# Patient Record
Sex: Male | Born: 1985 | Race: White | Hispanic: No | Marital: Single | Smoking: Never smoker
Health system: Southern US, Community
[De-identification: ages and names within clinical notes are randomized; demographics above are authoritative.]

## PROBLEM LIST (undated history)

## (undated) DIAGNOSIS — F410 Panic disorder [episodic paroxysmal anxiety] without agoraphobia: Secondary | ICD-10-CM

## (undated) DIAGNOSIS — F419 Anxiety disorder, unspecified: Secondary | ICD-10-CM

## (undated) DIAGNOSIS — I10 Essential (primary) hypertension: Secondary | ICD-10-CM

## (undated) HISTORY — PX: ANKLE SURGERY: SHX546

## (undated) HISTORY — PX: KNEE SURGERY: SHX244

## (undated) HISTORY — PX: ANKLE FRACTURE SURGERY: SHX122

---
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
## 2005-04-26 ENCOUNTER — Emergency Department (HOSPITAL_COMMUNITY): Admission: EM | Admit: 2005-04-26 | Discharge: 2005-04-26 | Payer: Self-pay | Admitting: Emergency Medicine

## 2006-02-04 ENCOUNTER — Ambulatory Visit: Payer: Self-pay | Admitting: Orthopedic Surgery

## 2006-03-05 ENCOUNTER — Ambulatory Visit: Payer: Self-pay | Admitting: Orthopedic Surgery

## 2006-05-31 ENCOUNTER — Emergency Department (HOSPITAL_COMMUNITY): Admission: EM | Admit: 2006-05-31 | Discharge: 2006-06-01 | Payer: Self-pay | Admitting: Emergency Medicine

## 2006-08-20 ENCOUNTER — Emergency Department (HOSPITAL_COMMUNITY): Admission: EM | Admit: 2006-08-20 | Discharge: 2006-08-20 | Payer: Self-pay | Admitting: Family Medicine

## 2006-09-17 ENCOUNTER — Emergency Department (HOSPITAL_COMMUNITY): Admission: EM | Admit: 2006-09-17 | Discharge: 2006-09-17 | Payer: Self-pay | Admitting: Emergency Medicine

## 2007-01-06 ENCOUNTER — Emergency Department: Payer: Self-pay | Admitting: Emergency Medicine

## 2007-01-22 ENCOUNTER — Emergency Department: Payer: Self-pay | Admitting: Emergency Medicine

## 2007-07-17 ENCOUNTER — Emergency Department: Payer: Self-pay | Admitting: Emergency Medicine

## 2007-12-22 ENCOUNTER — Emergency Department: Payer: Self-pay | Admitting: Emergency Medicine

## 2008-03-28 ENCOUNTER — Emergency Department (HOSPITAL_COMMUNITY): Admission: EM | Admit: 2008-03-28 | Discharge: 2008-03-28 | Payer: Self-pay | Admitting: Family Medicine

## 2008-08-16 ENCOUNTER — Emergency Department (HOSPITAL_COMMUNITY): Admission: EM | Admit: 2008-08-16 | Discharge: 2008-08-17 | Payer: Self-pay | Admitting: Emergency Medicine

## 2008-08-28 ENCOUNTER — Emergency Department (HOSPITAL_COMMUNITY): Admission: EM | Admit: 2008-08-28 | Discharge: 2008-08-28 | Payer: Self-pay | Admitting: Emergency Medicine

## 2009-02-19 ENCOUNTER — Emergency Department (HOSPITAL_COMMUNITY): Admission: EM | Admit: 2009-02-19 | Discharge: 2009-02-19 | Payer: Self-pay | Admitting: Emergency Medicine

## 2009-03-09 ENCOUNTER — Emergency Department (HOSPITAL_COMMUNITY): Admission: EM | Admit: 2009-03-09 | Discharge: 2009-03-09 | Payer: Self-pay | Admitting: Emergency Medicine

## 2010-08-11 ENCOUNTER — Inpatient Hospital Stay (HOSPITAL_COMMUNITY): Admission: EM | Admit: 2010-08-11 | Discharge: 2010-08-13 | Payer: Self-pay | Admitting: Orthopedic Surgery

## 2010-08-11 ENCOUNTER — Encounter: Payer: Self-pay | Admitting: Emergency Medicine

## 2011-04-12 NOTE — Op Note (Signed)
NAME:  Marvin Case, Marvin Case                         ACCOUNT NO.:  [id number]   MEDICAL RECORD NO.:  [id number]                   PATIENT TYPE:  AMB   LOCATION:  DSC                                  FACILITY:  [facility]   PHYSICIAN:  Robert A. Thurston Hole, M.D.              DATE OF BIRTH:  [birth year]   DATE OF PROCEDURE:  DATE OF DISCHARGE:                                 OPERATIVE REPORT   PREOPERATIVE DIAGNOSIS:  Left knee medial meniscus tear.   POSTOPERATIVE DIAGNOSIS:  Left knee medial meniscus tear.   PROCEDURE:  Left  exam under anesthesia, arthroscopic partial medial  meniscectomy.   SURGEON:  Elana Alm. Thurston Hole, M.D.   ASSISTANT:  Julien Girt, P.A.   ANESTHESIA:  General.   OPERATIVE TIME:  30 minutes.   COMPLICATIONS:  None.   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 Persistent pain  and intermittent swelling with examination and MRI documented a medial  meniscus tear who is now to undergo arthroscopy.   DESCRIPTION:  Jamieson was brought to the operating room on 08/23/02 and  placed on the operating table in supine position. After an adequate level of  general anesthesia was obtained, his left knee was examined under  anesthesia. Range of motion to 0 to 120 degrees, 1+ crepitation, stable  ligamentous exam with normal patellar tracking. Knee was sterilely injected  with 0.25% Marcaine with epinephrine. The leg was prepped using sterile  Duraprep and draped using sterile technique. Originally, through an  inferolateral portal, the arthroscope with a pump attached was placed into  an inferomedial portal, and arthroscopic probe was placed. On initial  inspection of the medial compartment, he had a displaced bucket-handle tear  of the posterior medial horn of the medial meniscus in the white-on-white  nonrepairable zone. This piece was removed and comprised 50% of the  posterior  medial horn. There was a very stable remaining  remnant intact.  The articular cartilage, medial femoral condyle, medial tibial plateau was  normal. Intercondylar notch inspected. Anterior and posterior cruciate  ligaments were normal. Lateral compartment inspected. Articular cartilage,  lateral femoral condyle, and lateral tibial plateau was normal.  Lateral  meniscus was probed, and this was found to be normal. Patellofemoral joint  inspected. The articular cartilage in this joint was normal. The patella  tracked normally. Medial and lateral gutters were free of pathology. After  this was done, it was felt that all pathology had been satisfactorily  addressed. The instruments were removed. Portals were closed with 3-0 nylon  sutures and injected with 0.25% Marcaine with epinephrine and 4 mg of  morphine. Sterile dressings applied, and the patient awakened and taken to  recovery room in stable condition.    FOLLOWUP CARE:  Khamron will be followed as an outpatient on Vicodin and  Naprosyn. See him  back in the office in a week for sutures out and followup.                                               Robert A. Thurston Hole, M.D.    RAW/MEDQ  D:  08/23/2002  T:  08/25/2002  Job:  [id number]

## 2012-11-14 ENCOUNTER — Other Ambulatory Visit: Payer: Self-pay

## 2012-11-15 ENCOUNTER — Emergency Department (HOSPITAL_COMMUNITY)
Admission: EM | Admit: 2012-11-15 | Discharge: 2012-11-15 | Discharge status: Against Medical Advice | Payer: Self-pay | Attending: Emergency Medicine | Admitting: Emergency Medicine

## 2012-11-15 ENCOUNTER — Encounter (HOSPITAL_COMMUNITY): Payer: Self-pay | Admitting: *Deleted

## 2012-11-15 DIAGNOSIS — R0602 Shortness of breath: Secondary | ICD-10-CM | POA: Insufficient documentation

## 2012-11-15 DIAGNOSIS — R079 Chest pain, unspecified: Secondary | ICD-10-CM | POA: Insufficient documentation

## 2012-11-15 LAB — CBC WITH DIFFERENTIAL/PLATELET
Basophils Absolute: 0 10*3/uL (ref 0.0–0.1)
Basophils Relative: 0 % (ref 0–1)
Eosinophils Absolute: 0.2 10*3/uL (ref 0.0–0.7)
Eosinophils Relative: 1 % (ref 0–5)
MCH: 31.7 pg (ref 26.0–34.0)
MCV: 89.9 fL (ref 78.0–100.0)
RDW: 12.8 % (ref 11.5–15.5)
WBC: 12 10*3/uL — ABNORMAL HIGH (ref 4.0–10.5)

## 2012-11-15 LAB — COMPREHENSIVE METABOLIC PANEL
Alkaline Phosphatase: 76 U/L (ref 39–117)
CO2: 25 mEq/L (ref 19–32)
Calcium: 9.4 mg/dL (ref 8.4–10.5)
Chloride: 97 mEq/L (ref 96–112)
Creatinine, Ser: 1.03 mg/dL (ref 0.50–1.35)
GFR calc non Af Amer: >90 mL/min (ref >90)
Glucose, Bld: 165 mg/dL — ABNORMAL HIGH (ref 70–99)
Potassium: 3.4 mEq/L — ABNORMAL LOW (ref 3.5–5.1)
Sodium: 137 mEq/L (ref 135–145)
Total Protein: 7.6 g/dL (ref 6.0–8.3)

## 2012-11-15 NOTE — ED Notes (Signed)
No answer x2.  Did not get his chest xray.  He had already gone whenever xray called for him

## 2012-11-15 NOTE — ED Notes (Signed)
Pt c/o sob and dull chest pain for 45 minutes.  History of the same

## 2012-11-17 ENCOUNTER — Emergency Department (INDEPENDENT_AMBULATORY_CARE_PROVIDER_SITE_OTHER)
Admission: EM | Admit: 2012-11-17 | Discharge: 2012-11-17 | Disposition: A | Discharge status: Home / Self Care | Payer: Self-pay | Source: Home / Self Care

## 2012-11-17 ENCOUNTER — Encounter (HOSPITAL_COMMUNITY): Payer: Self-pay | Admitting: Emergency Medicine

## 2012-11-17 DIAGNOSIS — F4001 Agoraphobia with panic disorder: Secondary | ICD-10-CM

## 2012-11-17 MED ORDER — LORAZEPAM 1 MG PO TABS: ORAL_TABLET | ORAL | Status: DC

## 2012-11-17 NOTE — ED Notes (Signed)
Pt c/o poss panic attacks... States he was at an event this past Saturday when he started "panacking"... Sx included: SOB, chest pressure... Pt today is asymptomatic... Father being treated w/panic attacks.

## 2012-11-17 NOTE — ED Provider Notes (Signed)
History     CSN: [id number]  Arrival date & time 11/17/12  1251   First MD Initiated Contact with Patient 11/17/12 1408      Chief Complaint  Patient presents with  . Panic Attack    (Consider location/radiation/quality/duration/timing/severity/associated sxs/prior treatment) HPI Comments: 26 year old male who was at a concert 3 nights ago and developed a sense of anxiety and claustrophobia. He was developing increased respiratory rate and palpitations. He left the concert went outside to get fresh air and he felt much better. His symptoms abated at that time. Since that occasion he is developed 2 additional episodes of anxiety where he felt as though the walls were closing in on him and being in a cry out or with other people were suffocating him. He has not had chest pain or shortness of breath. He states fresh air will help him. He works as a Aeronautical engineer. He denies history of cardiopulmonary disease or other known mental disorder.   History reviewed. No pertinent past medical history.  History reviewed. No pertinent past surgical history.  No family history on file.  History  Substance Use Topics  . Smoking status: Never Smoker   . Smokeless tobacco: Not on file  . Alcohol Use: Yes      Review of Systems  Constitutional: Negative.   HENT: Negative.   Respiratory: Negative.   Cardiovascular: Negative.   Gastrointestinal: Negative.   Neurological: Negative.   Psychiatric/Behavioral: The patient is nervous/anxious.     Allergies  Review of patient's allergies indicates no known allergies.  Home Medications   Current Outpatient Rx  Name  Route  Sig  Dispense  Refill  . LORAZEPAM 1 MG PO TABS      Take 1 at onset of panic attack at least 8 hours apart.   20 tablet   0     BP 136/82  Pulse 85  Temp 98.3 F (36.8 C) (Oral)  Resp 18  SpO2 99%  Physical Exam  Nursing note and vitals reviewed. Constitutional: He is oriented to  person, place, and time. He appears well-developed and well-nourished. No distress.  HENT:  Head: Normocephalic and atraumatic.  Eyes: Conjunctivae normal and EOM are normal.  Neck: Normal range of motion. Neck supple.  Cardiovascular: Normal rate, regular rhythm and normal heart sounds.   Pulmonary/Chest: Effort normal and breath sounds normal. No respiratory distress. He has no wheezes.  Musculoskeletal: Normal range of motion.  Lymphadenopathy:    He has no cervical adenopathy.  Neurological: He is alert and oriented to person, place, and time. No cranial nerve deficit. He exhibits normal muscle tone.  Skin: Skin is warm and dry. No rash noted.  Psychiatric: He has a normal mood and affect. His behavior is normal. Judgment and thought content normal. His speech is not rapid and/or pressured, not delayed, not tangential and not slurred. He is not agitated, not aggressive, is not hyperactive, not slowed, not withdrawn, not actively hallucinating and not combative. Cognition and memory are normal. He expresses no homicidal and no suicidal ideation. He is communicative. He is attentive.    ED Course  Procedures (including critical care time)  Labs Reviewed - No data to display No results found.   1. Panic disorder with agoraphobia       MDM  We discussed the nature of anxiety and panic attacks. He is aware that often this can smoker all and panic attacks they become more frequent and difficult to control. I strongly  recommended he obtain a physician to discuss this with possibly treat with an SSRI. In the meantime I have given him Ativan 1 mg to take 1/2-1 tablet every 8 hours if needed for panic disorder. Have asked him not to take it on an every 8 hour schedule. For any problems the symptoms are worsening may return. He is stable at this time and not exhibiting any panic behavior. His vital signs are stable, cognitively he is intact. Emotionally he is not currently demonstrating  outwardly anxiety or panic symptoms.       \  Hayden Rasmussen, NP 11/17/12 1432

## 2012-11-23 NOTE — ED Provider Notes (Signed)
Medical screening examination/treatment/procedure(s) were performed by resident physician or non-physician practitioner and as supervising physician I was immediately available for consultation/collaboration.   Barkley Bruns MD.    Linna Hoff, MD 11/23/12 [phone]

## 2012-12-28 ENCOUNTER — Emergency Department (HOSPITAL_COMMUNITY): Payer: Self-pay

## 2012-12-28 ENCOUNTER — Emergency Department (HOSPITAL_COMMUNITY)
Admission: EM | Admit: 2012-12-28 | Discharge: 2012-12-28 | Disposition: A | Discharge status: Home / Self Care | Payer: Self-pay | Attending: Emergency Medicine | Admitting: Emergency Medicine

## 2012-12-28 ENCOUNTER — Encounter (HOSPITAL_COMMUNITY): Payer: Self-pay | Admitting: Emergency Medicine

## 2012-12-28 DIAGNOSIS — R0602 Shortness of breath: Secondary | ICD-10-CM | POA: Insufficient documentation

## 2012-12-28 DIAGNOSIS — R000 Tachycardia, unspecified: Secondary | ICD-10-CM | POA: Insufficient documentation

## 2012-12-28 DIAGNOSIS — R61 Generalized hyperhidrosis: Secondary | ICD-10-CM | POA: Insufficient documentation

## 2012-12-28 DIAGNOSIS — F411 Generalized anxiety disorder: Secondary | ICD-10-CM | POA: Insufficient documentation

## 2012-12-28 DIAGNOSIS — M940 Chondrocostal junction syndrome [Tietze]: Secondary | ICD-10-CM | POA: Insufficient documentation

## 2012-12-28 DIAGNOSIS — Z79899 Other long term (current) drug therapy: Secondary | ICD-10-CM | POA: Insufficient documentation

## 2012-12-28 DIAGNOSIS — M6281 Muscle weakness (generalized): Secondary | ICD-10-CM | POA: Insufficient documentation

## 2012-12-28 LAB — BASIC METABOLIC PANEL
Creatinine, Ser: 0.92 mg/dL (ref 0.50–1.35)
GFR calc Af Amer: >90 mL/min (ref >90)
GFR calc non Af Amer: >90 mL/min (ref >90)
Potassium: 3.7 mEq/L (ref 3.5–5.1)

## 2012-12-28 LAB — D-DIMER, QUANTITATIVE: D-Dimer, Quant: 0.52 ug/mL-FEU — ABNORMAL HIGH (ref 0.00–0.48)

## 2012-12-28 LAB — CBC
Hemoglobin: 16.5 g/dL (ref 13.0–17.0)
MCH: 31.4 pg (ref 26.0–34.0)
Platelets: 216 10*3/uL (ref 150–400)
RBC: 5.25 MIL/uL (ref 4.22–5.81)
RDW: 12.3 % (ref 11.5–15.5)
WBC: 12.7 10*3/uL — ABNORMAL HIGH (ref 4.0–10.5)

## 2012-12-28 MED ORDER — IOHEXOL 350 MG/ML SOLN
100.0000 mL | Freq: Once | INTRAVENOUS | Status: AC | PRN
  Administered 2012-12-28: 100 mL via INTRAVENOUS

## 2012-12-28 MED ORDER — IBUPROFEN 800 MG PO TABS
800.0000 mg | ORAL_TABLET | Freq: Once | ORAL | Status: AC
  Administered 2012-12-28: 800 mg via ORAL
  Filled 2012-12-28: qty 1

## 2012-12-28 NOTE — ED Provider Notes (Signed)
History     CSN: [id number]  Arrival date & time 12/28/12  1503   First MD Initiated Contact with Patient 12/28/12 1610      Chief Complaint  Patient presents with  . Chest Pain    (Consider location/radiation/quality/duration/timing/severity/associated sxs/prior treatment) HPI Comments: 27 year old male with a history of anxiety presents to the emergency department complaining of chest pain for the past 4 hours. Pain began when he was driving this afternoon. Pain located on the left lateral aspect of his chest described as dull, achy, nonradiating rated 2/10. Admits to associated diaphoresis and mild left arm weakness. Patient drove out to [place] 3 weeks ago and was seen in the emergency department out there for similar symptoms, was given Ativan and was told there was nothing else wrong at that time. Ativan did not alleviate his symptoms. Denies nausea, vomiting, shortness of breath, leg swelling, calf pain, fever or chills. He drove back from [place] about one week ago. Denies personal and family history of heart problems. He is a nonsmoker.  The history is provided by the patient.    History reviewed. No pertinent past medical history.  History reviewed. No pertinent past surgical history.  History reviewed. No pertinent family history.  History  Substance Use Topics  . Smoking status: Never Smoker   . Smokeless tobacco: Not on file  . Alcohol Use: Yes      Review of Systems  Constitutional: Positive for diaphoresis.  HENT: Negative for neck pain and neck stiffness.   Respiratory: Negative for shortness of breath.   Cardiovascular: Positive for chest pain. Negative for leg swelling.  Gastrointestinal: Negative for nausea, vomiting and abdominal pain.  Musculoskeletal: Negative for back pain.  Skin: Negative for rash.  Neurological: Positive for weakness. Negative for dizziness and light-headedness.  All other systems reviewed and are negative.    Allergies    Review of patient's allergies indicates no known allergies.  Home Medications   Current Outpatient Rx  Name  Route  Sig  Dispense  Refill  . LORAZEPAM 1 MG PO TABS      Take 1 at onset of panic attack at least 8 hours apart.   20 tablet   0     BP 153/101  Pulse 140  Temp 98.9 F (37.2 C) (Oral)  Resp 22  SpO2 97%  Physical Exam  Nursing note and vitals reviewed. Constitutional: He is oriented to person, place, and time. He appears well-developed and well-nourished. No distress.  HENT:  Head: Normocephalic and atraumatic.  Mouth/Throat: Oropharynx is clear and moist. No oropharyngeal exudate.  Eyes: Conjunctivae normal and EOM are normal. Pupils are equal, round, and reactive to light. Right eye exhibits no discharge. Left eye exhibits no discharge.  Neck: Normal range of motion. Neck supple. No JVD present.  Cardiovascular: Normal rate, regular rhythm, normal heart sounds and intact distal pulses.  Exam reveals no gallop and no friction rub.   No murmur heard. Pulses:      Radial pulses are 2+ on the right side, and 2+ on the left side.       Dorsalis pedis pulses are 2+ on the right side, and 2+ on the left side.  Pulmonary/Chest: Effort normal and breath sounds normal. No respiratory distress. He has no wheezes. He has no rales.    Abdominal: Soft. Bowel sounds are normal. He exhibits no distension and no mass. There is no tenderness. There is no rebound and no guarding.  Musculoskeletal: Normal range of  motion.  Lymphadenopathy:    He has no cervical adenopathy.  Neurological: He is alert and oriented to person, place, and time. He has normal strength. No cranial nerve deficit or sensory deficit.  Skin: Skin is warm and dry. He is not diaphoretic.  Psychiatric: He has a normal mood and affect.    ED Course  Procedures (including critical care time)  Labs Reviewed  CBC - Abnormal; Notable for the following:    WBC 12.7 (*)     All other components within  normal limits  BASIC METABOLIC PANEL - Abnormal; Notable for the following:    Glucose, Bld 162 (*)     All other components within normal limits  D-DIMER, QUANTITATIVE - Abnormal; Notable for the following:    D-Dimer, Quant 0.52 (*)     All other components within normal limits  POCT I-STAT TROPONIN I    Date: 12/28/2012  Rate: 134  Rhythm: sinus tachycardia  QRS Axis: normal  Intervals: normal  ST/T Wave abnormalities: normal  Conduction Disutrbances:none  Narrative Interpretation: sinus tachycardia  Old EKG Reviewed: unchanged   Dg Chest 2 View  12/28/2012  *RADIOLOGY REPORT*  Clinical Data: Chest pain.  CHEST - 2 VIEW  Comparison: Chest CT 04/26/2005  Findings: Two views of the chest demonstrate clear lungs. Heart and mediastinum are within normal limits.  The trachea is midline. Bony thorax is intact.  IMPRESSION: Normal chest examination.   Original Report Authenticated By: Richarda Overlie, M.D.    Ct Angio Chest Pe W/cm &/or Wo Cm  12/28/2012  *RADIOLOGY REPORT*  Clinical Data: Elevated D-dimer and chest pain.  CT ANGIOGRAPHY CHEST  Technique:  Multidetector CT imaging of the chest using the standard protocol during bolus administration of intravenous contrast. Multiplanar reconstructed images including MIPs were obtained and reviewed to evaluate the vascular anatomy.  Contrast: OMNIPAQUE IOHEXOL 350 MG/ML SOLN  Comparison: Chest radiograph 12/28/2012  Findings: Negative for pulmonary embolism.  Normal appearance of the thoracic aorta and great vessels.  9 mm hypodensity along the hepatic dome is likely an incidental finding.  No evidence for chest lymphadenopathy.  The trachea and mainstem bronchi are patent.  Lungs are clear bilaterally.  No acute bony abnormality.  IMPRESSION: Negative for pulmonary embolism.  No acute chest findings.   Original Report Authenticated By: Richarda Overlie, M.D.      1. Costochondritis   2. Shortness of breath       MDM  27 year old male with  shortness of breath and tachycardia. D-dimer was elevated warranting need for CTA chest which was negative for pulmonary embolism. EKG normal besides tachycardia. Chest x-ray normal. Heart rate decreased to 95 after laying in the emergency department. He is tender in the left side of his chest making the diagnosis most likely chest wall pain/costochondritis. He does not have a primary care physician. Resource list given for PCP followup. Close return precautions discussed. Patient states his understanding of plan and is agreeable. Case discussed with Dr. Manus Gunning who also evaluated patient and agrees with plan of care        Trevor Mace, Cordelia Poche 12/28/12 2112

## 2012-12-28 NOTE — ED Notes (Signed)
Pt reports he was driving between jobs after eating lunch today and noticed his heart start racing. States came right in. Pt in mild distress, diaphoretic. States history of same and recent ER visit prescribed ativan. States no medical problems. C/o dull, achy pain to left chest and ribs.

## 2012-12-28 NOTE — ED Notes (Signed)
Patient transported to CT 

## 2012-12-29 NOTE — ED Provider Notes (Signed)
Medical screening examination/treatment/procedure(s) were conducted as a shared visit with non-physician practitioner(s) and myself.  I personally evaluated the patient during the encounter  L sided chest pain while driving with recent car travel. Sinus tachycardia and anxious on arrival. TTP L chest wall. Breath sounds equal.  Glynn Octave, MD 12/29/12 [phone]

## 2013-01-01 ENCOUNTER — Encounter (HOSPITAL_COMMUNITY): Payer: Self-pay | Admitting: Emergency Medicine

## 2013-01-01 ENCOUNTER — Emergency Department (HOSPITAL_COMMUNITY)
Admission: EM | Admit: 2013-01-01 | Discharge: 2013-01-01 | Discharge status: Against Medical Advice | Payer: Self-pay | Attending: Emergency Medicine | Admitting: Emergency Medicine

## 2013-01-01 DIAGNOSIS — R51 Headache: Secondary | ICD-10-CM | POA: Insufficient documentation

## 2013-01-01 DIAGNOSIS — R079 Chest pain, unspecified: Secondary | ICD-10-CM | POA: Insufficient documentation

## 2013-01-01 DIAGNOSIS — R5381 Other malaise: Secondary | ICD-10-CM | POA: Insufficient documentation

## 2013-01-01 NOTE — ED Notes (Signed)
Pts ekg performed at [facility]

## 2013-01-01 NOTE — ED Notes (Signed)
Pt reports chest pain that is sharp 10/10 that is worse today. Pt reports not feeling well today. Pt in NAD. Skin warm and dry. Denies drug use or ETOH.

## 2013-01-01 NOTE — ED Notes (Signed)
Pt called 3 times, no response 

## 2013-02-13 ENCOUNTER — Emergency Department: Payer: Self-pay | Admitting: Emergency Medicine

## 2013-02-13 LAB — CBC WITH DIFFERENTIAL/PLATELET
Basophil #: 0.1 10*3/uL (ref 0.0–0.1)
Eosinophil #: 0.1 10*3/uL (ref 0.0–0.7)
Eosinophil %: 0.9 %
HCT: 44.8 % (ref 40.0–52.0)
MCH: 29.7 pg (ref 26.0–34.0)
Monocyte #: 0.8 x10 3/mm (ref 0.2–1.0)
Monocyte %: 6.6 %
Platelet: 251 10*3/uL (ref 150–440)
WBC: 12 10*3/uL — ABNORMAL HIGH (ref 3.8–10.6)

## 2013-02-13 LAB — COMPREHENSIVE METABOLIC PANEL
Albumin: 4.1 g/dL (ref 3.4–5.0)
Alkaline Phosphatase: 68 U/L (ref 50–136)
BUN: 11 mg/dL (ref 7–18)
Bilirubin,Total: 0.5 mg/dL (ref 0.2–1.0)
Chloride: 105 mmol/L (ref 98–107)
Osmolality: 279 (ref 275–301)
Potassium: 3.3 mmol/L — ABNORMAL LOW (ref 3.5–5.1)
SGOT(AST): 26 U/L (ref 15–37)
Sodium: 139 mmol/L (ref 136–145)
Total Protein: 8 g/dL (ref 6.4–8.2)

## 2013-02-13 LAB — URINALYSIS, COMPLETE
Bacteria: NONE SEEN
Bilirubin,UR: NEGATIVE
Glucose,UR: NEGATIVE mg/dL (ref 0–75)
Ketone: NEGATIVE
Nitrite: NEGATIVE
Ph: 6 (ref 4.5–8.0)
Protein: NEGATIVE
RBC,UR: 1 /HPF (ref 0–5)
Squamous Epithelial: <1

## 2013-02-13 LAB — TROPONIN I: Troponin-I: <0.02 ng/mL

## 2013-04-21 IMAGING — CT CT ANGIO CHEST
2 of 8 series · 16 of 36 positions shown · IV contrast (CONTRAST)
Comparison: Chest radiograph 12/28/2012

CLINICAL DATA: Elevated D-dimer and chest pain.

CT ANGIOGRAPHY CHEST
TECHNIQUE: Multidetector CT imaging of the chest using the
standard protocol during bolus administration of intravenous
contrast. Multiplanar reconstructed images including MIPs were
obtained and reviewed to evaluate the vascular anatomy.
Contrast: 100mL OMNIPAQUE IOHEXOL 350 MG/ML SOLN

[Series 5: pe thins · axial · 0.81mm/px · z∈[+451,+710]mm · 15 of 297 slices shown]
[im 19/297  lung]
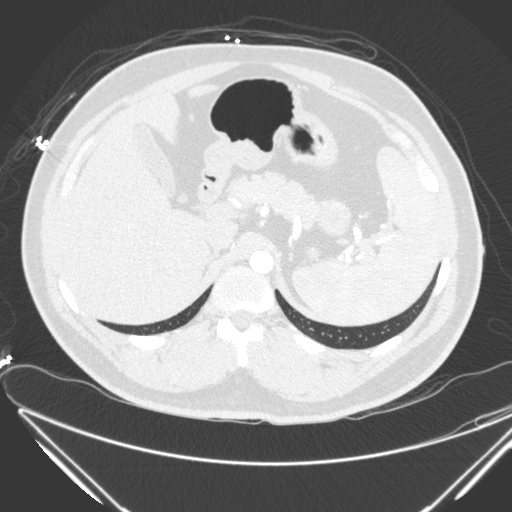
[im 38/297  mediastinal]
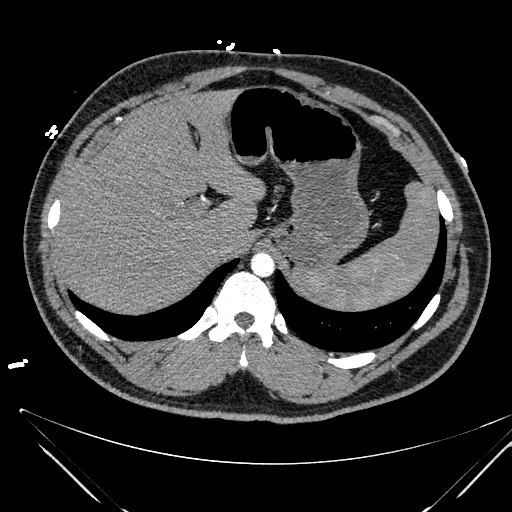
[im 56/297  lung]
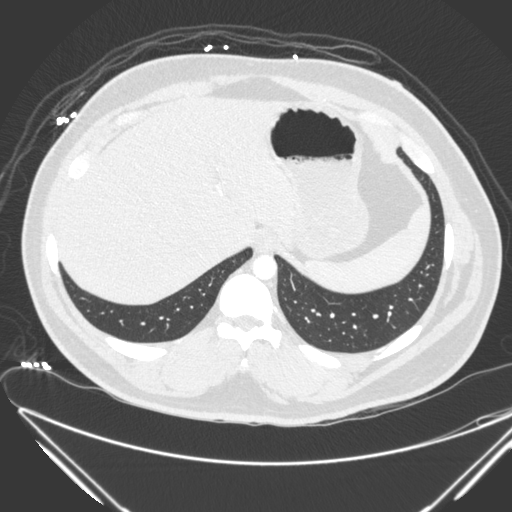
[im 75/297  mediastinal]
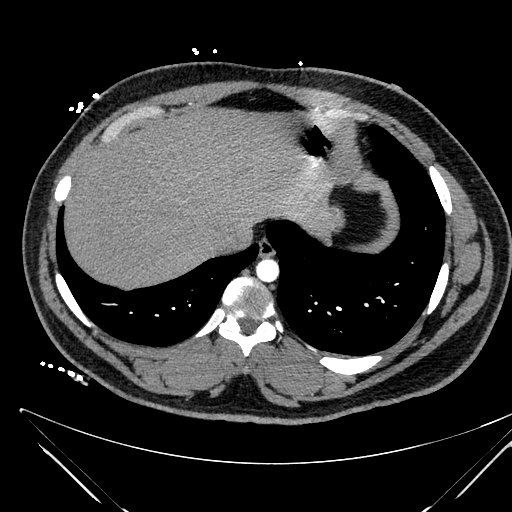
[im 93/297  lung]
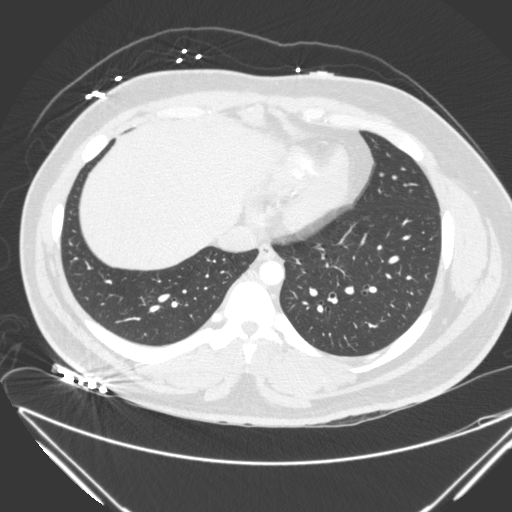
[im 112/297  mediastinal]
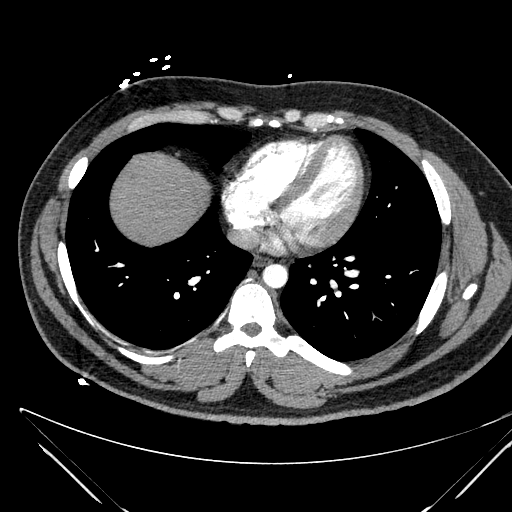
[im 130/297  lung]
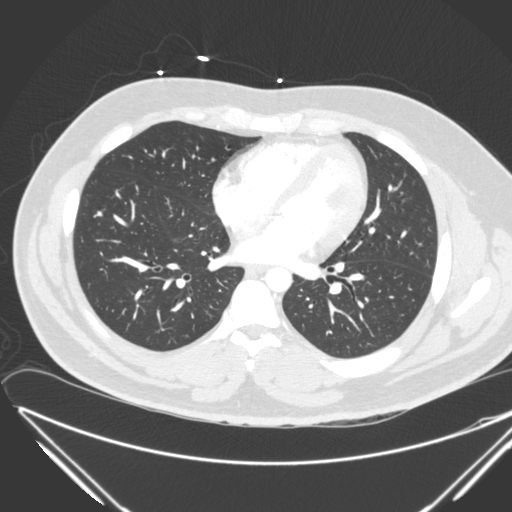
[im 149/297  mediastinal]
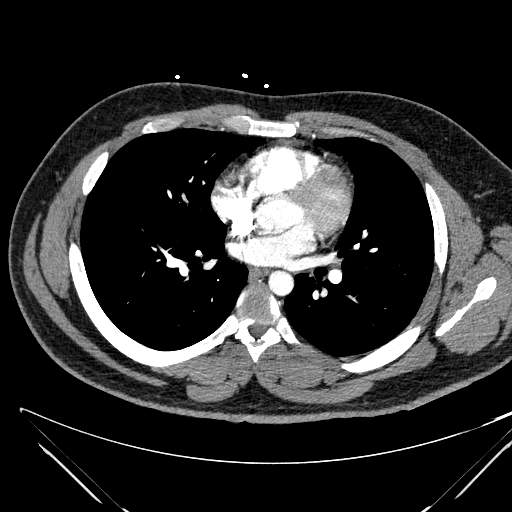
[im 167/297  lung]
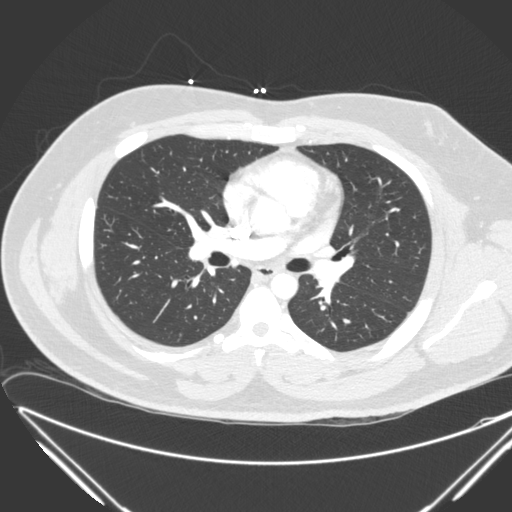
[im 186/297  mediastinal]
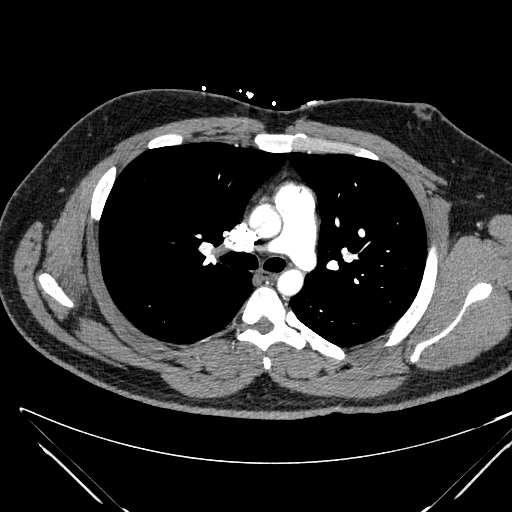
[im 204/297  lung]
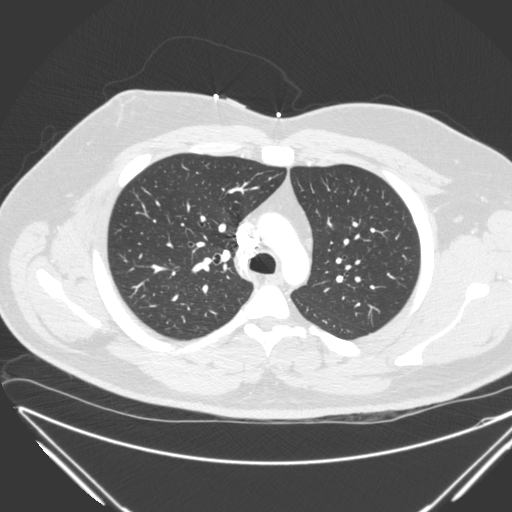
[im 223/297  mediastinal]
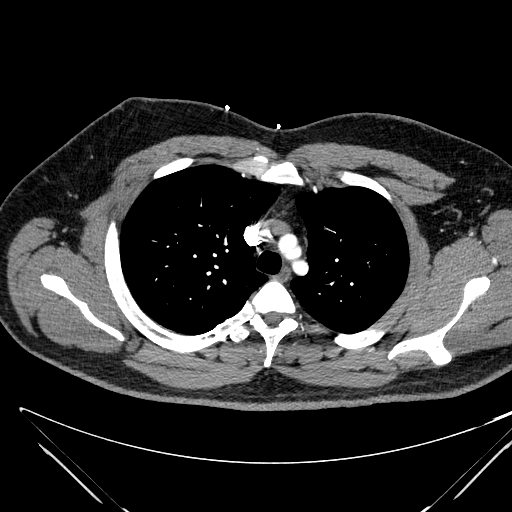
[im 241/297  lung]
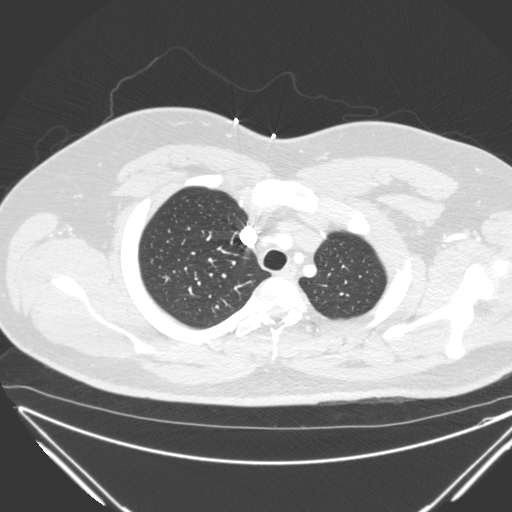
[im 260/297  mediastinal]
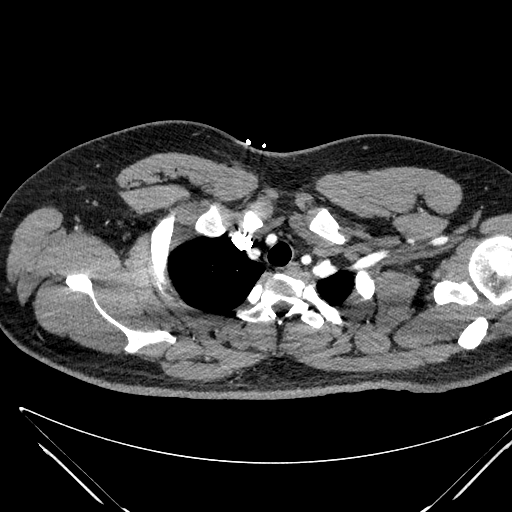
[im 278/297  lung]
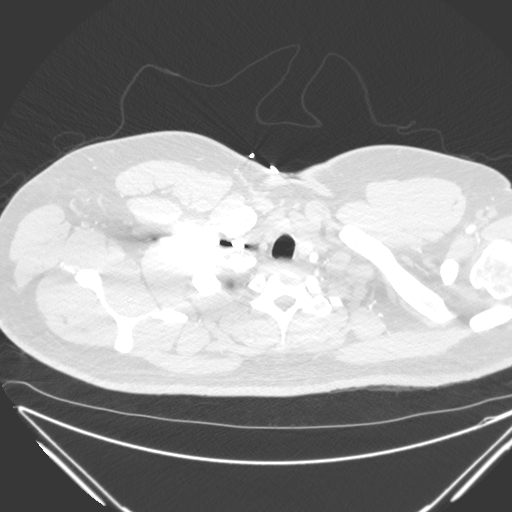

[mpr, coronals, coronal · coronal · 0.81mm/px · 1 of 142 slices shown]
[im 71/142  mediastinal]
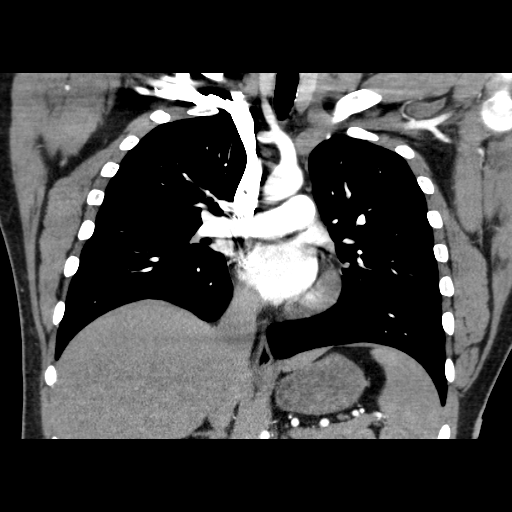

[16 of 36 positions shown; findings below may reference images not displayed]

FINDINGS: Negative for pulmonary embolism.  Normal appearance of
the thoracic aorta and great vessels.  9 mm hypodensity along the
hepatic dome is likely an incidental finding.  No evidence for
chest lymphadenopathy.

The trachea and mainstem bronchi are patent.  Lungs are clear
bilaterally.  No acute bony abnormality.
IMPRESSION: Negative for pulmonary embolism.

No acute chest findings.

## 2013-10-24 ENCOUNTER — Emergency Department (HOSPITAL_COMMUNITY)
Admission: EM | Admit: 2013-10-24 | Discharge: 2013-10-24 | Disposition: A | Discharge status: Home / Self Care | Payer: Self-pay | Attending: Emergency Medicine | Admitting: Emergency Medicine

## 2013-10-24 ENCOUNTER — Encounter (HOSPITAL_COMMUNITY): Payer: Self-pay | Admitting: Emergency Medicine

## 2013-10-24 DIAGNOSIS — J069 Acute upper respiratory infection, unspecified: Secondary | ICD-10-CM | POA: Insufficient documentation

## 2013-10-24 DIAGNOSIS — R51 Headache: Secondary | ICD-10-CM | POA: Insufficient documentation

## 2013-10-24 DIAGNOSIS — R110 Nausea: Secondary | ICD-10-CM | POA: Insufficient documentation

## 2013-10-24 DIAGNOSIS — R61 Generalized hyperhidrosis: Secondary | ICD-10-CM | POA: Insufficient documentation

## 2013-10-24 MED ORDER — ONDANSETRON HCL 4 MG PO TABS: 4.0000 mg | ORAL_TABLET | Freq: Four times a day (QID) | ORAL | Status: DC

## 2013-10-24 MED ORDER — BUTALBITAL-APAP-CAFFEINE 50-325-40 MG PO TABS: 1.0000 | ORAL_TABLET | Freq: Once | ORAL | Status: DC

## 2013-10-24 MED ORDER — DM-GUAIFENESIN ER 30-600 MG PO TB12: 1.0000 | ORAL_TABLET | Freq: Two times a day (BID) | ORAL | Status: DC

## 2013-10-24 MED ORDER — BUTALBITAL-APAP-CAFFEINE 50-325-40 MG PO TABS: 1.0000 | ORAL_TABLET | ORAL | Status: DC | PRN

## 2013-10-24 MED ORDER — IBUPROFEN 400 MG PO TABS: 800.0000 mg | ORAL_TABLET | Freq: Once | ORAL | Status: DC

## 2013-10-24 NOTE — ED Provider Notes (Signed)
Pt was seen by [name], PA-C and supervised by Dierdre Forth, PA-C and Benjiman Core, MD.     Marvin Case is a 27 y.o. male presents with nasal congestion, cough, body aches and low grade fever beginning last night.  Pt has taken OTC theraflu with moderate relief of symptoms.  He denies, rash, neck pain/stiffness, chest pain, SOB, abd pain, vomiting, diarrhea, weakness, dizziness, syncope, dysuria.  Pt denies known medical Hx.  Nothing seems to make his ssx worse.    Face to face Exam:   General: Awake  HEENT: Atraumatic, TM nonbulging, nonerythematous, nasal edema, sinus congestion Resp: Normal effort, clear and equal breath sounds Cardiac: mild tachycardia, regular rhythm, no murmur Abd: Nondistended, soft and nontender Neuro:No focal weakness  Lymph: No adenopathy Skin: no petechial or purpuric rash   Patient with symptoms consistent with influenza.  Vitals are stable, low-grade fever.  No signs of dehydration, tolerating PO's.  Lungs are clear. Due to patient's presentation and physical exam a chest x-ray was not ordered bc likely diagnosis of flu.  Discussed the cost versus benefit of Tamiflu treatment with the patient and he declines the medication.  Patient will be discharged with instructions to orally hydrate, rest, and use over-the-counter medications such as anti-inflammatories ibuprofen and Aleve for muscle aches and Tylenol for fever.  Pt will also be given Zofran for nausea.    It has been determined that no acute conditions requiring further emergency intervention are present at this time. The patient/guardian have been advised of the diagnosis and plan. We have discussed signs and symptoms that warrant return to the ED, such as changes or worsening in symptoms.   Vital signs are stable at discharge.   BP 135/92  Pulse 117  Temp(Src) 99.5 F (37.5 C) (Oral)  Resp 18  Wt 258 lb (117.028 kg)  SpO2 99%  Patient/guardian has voiced understanding and agreed  to follow-up with the PCP or specialist.          Dierdre Forth, PA-C 10/24/13 1607

## 2013-10-24 NOTE — ED Provider Notes (Signed)
CSN: [id number]     Arrival date & time 10/24/13  1404 History   First MD Initiated Contact with Patient 10/24/13 1452     Chief Complaint  Patient presents with  . URI   (Consider location/radiation/quality/duration/timing/severity/associated sxs/prior Treatment) HPI 27 yo male presents with Fever, Chills, and body aches x 1 day. Patient admits to HA. Denies neck pain/stiffness. Admits to nausea, denies vomiting. Admits to cough and congestion. No shortness of breath. Admits to fever of 101 this morning and tried tylenol cough and cold with little relief. Patient's spouse had similar symptoms a week ago but since resolved.   History reviewed. No pertinent past medical history. History reviewed. No pertinent past surgical history. History reviewed. No pertinent family history. History  Substance Use Topics  . Smoking status: Never Smoker   . Smokeless tobacco: Not on file  . Alcohol Use: No    Review of Systems  Constitutional: Positive for fever, chills, diaphoresis and appetite change. Negative for fatigue.  HENT: Positive for congestion and rhinorrhea. Negative for ear discharge, ear pain, hearing loss, sinus pressure, sore throat and trouble swallowing.   Respiratory: Positive for cough (Non productive). Negative for shortness of breath.   Cardiovascular: Negative for chest pain.  Gastrointestinal: Positive for nausea. Negative for vomiting, abdominal pain and diarrhea.  Musculoskeletal: Positive for myalgias. Negative for neck pain and neck stiffness.  Skin: Negative for rash.  Neurological: Positive for headaches.    Allergies  Review of patient's allergies indicates no known allergies.  Home Medications   Current Outpatient Rx  Name  Route  Sig  Dispense  Refill  . Chlorphen-Pseudoephed-APAP (THERAFLU FLU/COLD PO)   Oral   Take 1 packet by mouth daily as needed (cold).         . Phenyleph-CPM-DM-APAP (TYLENOL COLD HEAD CONGESTION PO)   Oral   Take 2 tablets by  mouth daily as needed (cold).         Marland Kitchen dextromethorphan-guaiFENesin (MUCINEX DM) 30-600 MG per 12 hr tablet   Oral   Take 1 tablet by mouth 2 (two) times daily.   20 tablet   0   . ondansetron (ZOFRAN) 4 MG tablet   Oral   Take 1 tablet (4 mg total) by mouth every 6 (six) hours.   12 tablet   0    BP 135/92  Pulse 117  Temp(Src) 99.5 F (37.5 C) (Oral)  Resp 18  Wt 258 lb (117.028 kg)  SpO2 99% Physical Exam  Vitals reviewed. Constitutional: He is oriented to person, place, and time. He appears well-developed and well-nourished. No distress.  HENT:  Head: Normocephalic and atraumatic.  Mouth/Throat: No oropharyngeal exudate.  Eyes: Conjunctivae and EOM are normal. Pupils are equal, round, and reactive to light. Right eye exhibits no discharge. Left eye exhibits no discharge. No scleral icterus.  Neck: Normal range of motion. Neck supple.  Cardiovascular: Normal rate, regular rhythm and normal heart sounds.  Exam reveals no gallop and no friction rub.   No murmur heard. Pulmonary/Chest: Effort normal and breath sounds normal. He has no wheezes. He has no rales.  Abdominal: Soft. Bowel sounds are normal. He exhibits no distension. There is no tenderness.  Musculoskeletal: Normal range of motion.  Lymphadenopathy:    He has no cervical adenopathy.  Neurological: He is alert and oriented to person, place, and time.  Skin: Skin is warm and dry. No rash noted. He is not diaphoretic.  Psychiatric: He has a normal mood and affect. His  behavior is normal.    ED Course  Procedures (including critical care time) Labs Review Labs Reviewed - No data to display Imaging Review No results found.  EKG Interpretation   None       MDM   1. URI, acute   2. Headache     Patient presents with fever and body aches x 1 day. Patient afebrile in ED. Patient Lungs clear. No neck tenderness or stiffness on exam. Patient does not appear acutely ill. Doubt meningitis, doubt  pneumonia. Suspect viral URI. Patient treated for HA and given rx for nausea medication. Recommend supportive therapy with plenty of fluids and rest. Patient advised to return if symptoms worsen.    Rudene Anda, PA-C 10/24/13 2314  Rudene Anda, PA-C 10/24/13 [phone]

## 2013-10-24 NOTE — ED Provider Notes (Signed)
Medical screening examination/treatment/procedure(s) were performed by non-physician practitioner and as supervising physician I was immediately available for consultation/collaboration.  EKG Interpretation   None        Rishika Mccollom R. Leonora Gores, MD 10/24/13 2356 

## 2013-10-24 NOTE — ED Notes (Signed)
Pt c/o congestion, body aches, fevers since last night. Took otc meds with some relief of symptoms.

## 2013-10-25 NOTE — ED Provider Notes (Signed)
Medical screening examination/treatment/procedure(s) were performed by non-physician practitioner and as supervising physician I was immediately available for consultation/collaboration.  EKG Interpretation   None         Gwyneth Sprout, MD 10/25/13 [phone]

## 2014-05-11 ENCOUNTER — Emergency Department: Payer: Self-pay | Admitting: Internal Medicine

## 2014-05-12 ENCOUNTER — Encounter (HOSPITAL_COMMUNITY): Payer: Self-pay | Admitting: Emergency Medicine

## 2014-05-12 ENCOUNTER — Emergency Department (HOSPITAL_COMMUNITY)
Admission: EM | Admit: 2014-05-12 | Discharge: 2014-05-12 | Disposition: A | Discharge status: Home / Self Care | Payer: Self-pay | Attending: Emergency Medicine | Admitting: Emergency Medicine

## 2014-05-12 DIAGNOSIS — R42 Dizziness and giddiness: Secondary | ICD-10-CM | POA: Insufficient documentation

## 2014-05-12 DIAGNOSIS — E860 Dehydration: Secondary | ICD-10-CM | POA: Insufficient documentation

## 2014-05-12 DIAGNOSIS — R51 Headache: Secondary | ICD-10-CM | POA: Insufficient documentation

## 2014-05-12 DIAGNOSIS — IMO0001 Reserved for inherently not codable concepts without codable children: Secondary | ICD-10-CM | POA: Insufficient documentation

## 2014-05-12 DIAGNOSIS — R5383 Other fatigue: Secondary | ICD-10-CM

## 2014-05-12 DIAGNOSIS — R519 Headache, unspecified: Secondary | ICD-10-CM

## 2014-05-12 DIAGNOSIS — R5381 Other malaise: Secondary | ICD-10-CM | POA: Insufficient documentation

## 2014-05-12 LAB — CBC WITH DIFFERENTIAL/PLATELET
BASOS ABS: 0 10*3/uL (ref 0.0–0.1)
BASOS PCT: 0 % (ref 0–1)
EOS ABS: 0.1 10*3/uL (ref 0.0–0.7)
Eosinophils Relative: 1 % (ref 0–5)
HCT: 44.4 % (ref 39.0–52.0)
HEMOGLOBIN: 15.5 g/dL (ref 13.0–17.0)
Lymphocytes Relative: 22 % (ref 12–46)
Lymphs Abs: 1.8 10*3/uL (ref 0.7–4.0)
MCH: 30.8 pg (ref 26.0–34.0)
MCHC: 34.9 g/dL (ref 30.0–36.0)
MCV: 88.3 fL (ref 78.0–100.0)
MONOS PCT: 8 % (ref 3–12)
Monocytes Absolute: 0.7 10*3/uL (ref 0.1–1.0)
NEUTROS PCT: 69 % (ref 43–77)
Neutro Abs: 5.6 10*3/uL (ref 1.7–7.7)
Platelets: 234 10*3/uL (ref 150–400)
RBC: 5.03 MIL/uL (ref 4.22–5.81)
RDW: 12.7 % (ref 11.5–15.5)
WBC: 8.1 10*3/uL (ref 4.0–10.5)

## 2014-05-12 LAB — URINALYSIS, ROUTINE W REFLEX MICROSCOPIC
BILIRUBIN URINE: NEGATIVE
Glucose, UA: NEGATIVE mg/dL
Hgb urine dipstick: NEGATIVE
KETONES UR: NEGATIVE mg/dL
Leukocytes, UA: NEGATIVE
NITRITE: NEGATIVE
Protein, ur: NEGATIVE mg/dL
SPECIFIC GRAVITY, URINE: 1.02 (ref 1.005–1.030)
UROBILINOGEN UA: 1 mg/dL (ref 0.0–1.0)
pH: 6 (ref 5.0–8.0)

## 2014-05-12 LAB — BASIC METABOLIC PANEL
BUN: 14 mg/dL (ref 6–23)
CO2: 27 mEq/L (ref 19–32)
CREATININE: 0.83 mg/dL (ref 0.50–1.35)
Calcium: 9.1 mg/dL (ref 8.4–10.5)
Chloride: 100 mEq/L (ref 96–112)
Glucose, Bld: 94 mg/dL (ref 70–99)
POTASSIUM: 4.3 meq/L (ref 3.7–5.3)
Sodium: 138 mEq/L (ref 137–147)

## 2014-05-12 MED ORDER — SODIUM CHLORIDE 0.9 % IV BOLUS (SEPSIS)
1000.0000 mL | Freq: Once | INTRAVENOUS | Status: AC
  Administered 2014-05-12: 1000 mL via INTRAVENOUS

## 2014-05-12 NOTE — ED Provider Notes (Signed)
CSN: [id number]     Arrival date & time 05/12/14  [id number] History   First MD Initiated Contact with Patient 05/12/14 [phone]     Chief Complaint  Patient presents with  . Heat Exposure   HPI  Marvin Case is a 28 y.o. male with no PMH who presents to the ED for evaluation of heat exposure. History was provided by the patient. 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 He complains of fatigue, generalized weakness, myalgias, muscle cramping, and lightheadedness, which started last night. He states he drank Gatorade before bed last night, but he still felt ill this morning. He complains of a throbbing constant generalized headache. Nothing makes it better/worse. He did not take anything for this PTA. He states he feels more lightheaded with standing and sitting upright. He denies any fever, cough, congestion, sore throat, abdominal pain, chest pain, palpitations, SOB, emesis, diarrhea, dysuria, decreased urination, focal weakness, numbness/tingling, or loss of sensation.    History reviewed. No pertinent past medical history. Past Surgical History  Procedure Laterality Date  . Knee surgery    . Ankle fracture surgery     History reviewed. No pertinent family history. History  Substance Use Topics  . Smoking status: Never Smoker   . Smokeless tobacco: Not on file  . Alcohol Use: No    Review of Systems  Constitutional: Positive for fatigue. Negative for fever, chills, diaphoresis, activity change and appetite change.  HENT: Negative for congestion and sore throat.   Eyes: Negative for photophobia and visual disturbance.  Respiratory: Negative for cough and shortness of breath.   Cardiovascular: Negative for chest pain and leg swelling.  Gastrointestinal: Negative for nausea, vomiting, abdominal pain and diarrhea.  Genitourinary: Negative for dysuria, decreased urine volume and difficulty urinating.   Musculoskeletal: Positive for myalgias. Negative for arthralgias, back pain, neck pain and neck stiffness.  Skin: Negative for color change and rash.  Neurological: Positive for weakness (generalized), light-headedness and headaches. Negative for dizziness and numbness.    Allergies  Review of patient's allergies indicates no known allergies.  Home Medications   Prior to Admission medications   Not on File   BP 124/68  Pulse 95  Temp(Src) 99 F (37.2 C) (Rectal)  Ht 5\' 7"  (1.702 m)  Wt 230 lb (104.327 kg)  BMI 36.01 kg/m2  SpO2 98%  Filed Vitals:   05/12/14 1030 05/12/14 1054 05/12/14 1100 05/12/14 1130  BP: 115/60 115/60 131/70 119/93  Pulse: 76 84 92 86  Temp:      TempSrc:      Resp: 15 18 22 24   Height:      Weight:      SpO2: 100% 99% 100% 100%    Physical Exam  Nursing note and vitals reviewed. Constitutional: He is oriented to person, place, and time. He appears well-developed and well-nourished. No distress.  HENT:  Head: Normocephalic and atraumatic.  Right Ear: External ear normal.  Left Ear: External ear normal.  Mouth/Throat: Oropharynx is clear and moist.  Eyes: Conjunctivae are normal. Right eye exhibits no discharge. Left eye exhibits no discharge.  Neck: Normal range of motion. Neck supple.  Cardiovascular: Normal rate, regular rhythm, normal heart sounds and intact distal pulses.  Exam reveals no gallop and no friction rub.   No murmur heard. Pulmonary/Chest: Effort normal and breath sounds normal. No respiratory distress. He has no wheezes. He has no rales. He exhibits  no tenderness.  Abdominal: Soft. He exhibits no distension. There is no tenderness.  Musculoskeletal: Normal range of motion. He exhibits no edema and no tenderness.  Patient moving all extremities throughout exam. No LE edema or calf tenderness bilaterally  Neurological: He is alert and oriented to person, place, and time.  GCS 15. No focal neurological deficits. CN 2-12 intact.    Skin: Skin is warm and dry. He is not diaphoretic.    ED Course  Procedures (including critical care time) Labs Review Labs Reviewed - No data to display  Imaging Review No results found.   EKG Interpretation None      Results for orders placed during the hospital encounter of 05/12/14  CBC WITH DIFFERENTIAL      Result Value Ref Range   WBC 8.1  4.0 - 10.5 K/uL   RBC 5.03  4.22 - 5.81 MIL/uL   Hemoglobin 15.5  13.0 - 17.0 g/dL   HCT 62.144.4  30.839.0 - 65.752.0 %   MCV 88.3  78.0 - 100.0 fL   MCH 30.8  26.0 - 34.0 pg   MCHC 34.9  30.0 - 36.0 g/dL   RDW 84.612.7  96.211.5 - 95.215.5 %   Platelets 234  150 - 400 K/uL   Neutrophils Relative % 69  43 - 77 %   Neutro Abs 5.6  1.7 - 7.7 K/uL   Lymphocytes Relative 22  12 - 46 %   Lymphs Abs 1.8  0.7 - 4.0 K/uL   Monocytes Relative 8  3 - 12 %   Monocytes Absolute 0.7  0.1 - 1.0 K/uL   Eosinophils Relative 1  0 - 5 %   Eosinophils Absolute 0.1  0.0 - 0.7 K/uL   Basophils Relative 0  0 - 1 %   Basophils Absolute 0.0  0.0 - 0.1 K/uL  BASIC METABOLIC PANEL      Result Value Ref Range   Sodium 138  137 - 147 mEq/L   Potassium 4.3  3.7 - 5.3 mEq/L   Chloride 100  96 - 112 mEq/L   CO2 27  19 - 32 mEq/L   Glucose, Bld 94  70 - 99 mg/dL   BUN 14  6 - 23 mg/dL   Creatinine, Ser 8.410.83  0.50 - 1.35 mg/dL   Calcium 9.1  8.4 - 32.410.5 mg/dL   GFR calc non Af Amer >90  >90 mL/min   GFR calc Af Amer >90  >90 mL/min  URINALYSIS, ROUTINE W REFLEX MICROSCOPIC      Result Value Ref Range   Color, Urine YELLOW  YELLOW   APPearance CLEAR  CLEAR   Specific Gravity, Urine 1.020  1.005 - 1.030   pH 6.0  5.0 - 8.0   Glucose, UA NEGATIVE  NEGATIVE mg/dL   Hgb urine dipstick NEGATIVE  NEGATIVE   Bilirubin Urine NEGATIVE  NEGATIVE   Ketones, ur NEGATIVE  NEGATIVE mg/dL   Protein, ur NEGATIVE  NEGATIVE mg/dL   Urobilinogen, UA 1.0  0.0 - 1.0 mg/dL   Nitrite NEGATIVE  NEGATIVE   Leukocytes, UA NEGATIVE  NEGATIVE    MDM   Marvin QuayRichard E Kleine is a 28 y.o. male with  no PMH who presents to the ED for evaluation of heat exposure. Patient likely dehydrated. Had lightheadedness, fatigue, generalized weakness, and headache which resolved after 2L IV fluids and PO fluids. Vital signs stable. Labs unremarkable with no electrolyte abnormalities or anemia. Patient instructed to drink fluids. Return precautions, discharge instructions, and  follow-up was discussed with the patient before discharge.      Rechecks  11:30 AM = Patient states he feels much better. Headache resolved. No lightheadedness. Patient able to ambulate without difficulty or ataxia. Able to tolerate PO challenge.     There are no discharge medications for this patient.   Final impressions: 1. Dehydration   2. Headache   3. Lightheadedness      Greer Ee Palmer PA-C           Jillyn Ledger, PA-C 05/12/14 1210

## 2014-05-12 NOTE — ED Notes (Addendum)
Pt from home with c/o heat exhaustion and dehydration; migraine, "cramping up, and kidneys have been hurting."  Pt also reports orthostatic lightheadedness.  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  Pt in NAD, A&O.

## 2014-05-12 NOTE — ED Provider Notes (Signed)
Medical screening examination/treatment/procedure(s) were conducted as a shared visit with non-physician practitioner(s) and myself.  I personally evaluated the patient during the encounter.   EKG Interpretation None      I interviewed and examined the patient. Lungs are CTAB. Cardiac exam wnl. Abdomen soft.  Pt appears well VS unremarkable. Labs non-contrib. Pt hydrated. Return precautions provided.   Junius ArgyleForrest S Rodgerick Gilliand, MD 05/12/14 2152

## 2014-05-12 NOTE — Discharge Instructions (Signed)
Drink fluids and rest  Return to the emergency department if you develop any changing/worsening condition, passing out, pain, fever or any other concerns (please read additional information regarding your condition below)    Dehydration, Adult Dehydration is when you lose more fluids from the body than you take in. Vital organs like the kidneys, brain, and heart cannot function without a proper amount of fluids and salt. Any loss of fluids from the body can cause dehydration.  CAUSES   Vomiting.  Diarrhea.  Excessive sweating.  Excessive urine output.  Fever. SYMPTOMS  Mild dehydration  Thirst.  Dry lips.  Slightly dry mouth. Moderate dehydration  Very dry mouth.  Sunken eyes.  Skin does not bounce back quickly when lightly pinched and released.  Dark urine and decreased urine production.  Decreased tear production.  Headache. Severe dehydration  Very dry mouth.  Extreme thirst.  Rapid, weak pulse (more than 100 beats per minute at rest).  Cold hands and feet.  Not able to sweat in spite of heat and temperature.  Rapid breathing.  Blue lips.  Confusion and lethargy.  Difficulty being awakened.  Minimal urine production.  No tears. DIAGNOSIS  Your caregiver will diagnose dehydration based on your symptoms and your exam. Blood and urine tests will help confirm the diagnosis. The diagnostic evaluation should also identify the cause of dehydration. TREATMENT  Treatment of mild or moderate dehydration can often be done at home by increasing the amount of fluids that you drink. It is best to drink small amounts of fluid more often. Drinking too much at one time can make vomiting worse. Refer to the home care instructions below. Severe dehydration needs to be treated at the hospital where you will probably be given intravenous (IV) fluids that contain water and electrolytes. HOME CARE INSTRUCTIONS   Ask your caregiver about specific rehydration  instructions.  Drink enough fluids to keep your urine clear or pale yellow.  Drink small amounts frequently if you have nausea and vomiting.  Eat as you normally do.  Avoid:  Foods or drinks high in sugar.  Carbonated drinks.  Juice.  Extremely hot or cold fluids.  Drinks with caffeine.  Fatty, greasy foods.  Alcohol.  Tobacco.  Overeating.  Gelatin desserts.  Wash your hands well to avoid spreading bacteria and viruses.  Only take over-the-counter or prescription medicines for pain, discomfort, or fever as directed by your caregiver.  Ask your caregiver if you should continue all prescribed and over-the-counter medicines.  Keep all follow-up appointments with your caregiver. SEEK MEDICAL CARE IF:  You have abdominal pain and it increases or stays in one area (localizes).  You have a rash, stiff neck, or severe headache.  You are irritable, sleepy, or difficult to awaken.  You are weak, dizzy, or extremely thirsty. SEEK IMMEDIATE MEDICAL CARE IF:   You are unable to keep fluids down or you get worse despite treatment.  You have frequent episodes of vomiting or diarrhea.  You have blood or green matter (bile) in your vomit.  You have blood in your stool or your stool looks black and tarry.  You have not urinated in 6 to 8 hours, or you have only urinated a small amount of very dark urine.  You have a fever.  You faint. MAKE SURE YOU:   Understand these instructions.  Will watch your condition.  Will get help right away if you are not doing well or get worse. Document Released: 11/11/2005 Document Revised: 02/03/2012 Document Reviewed:  07/01/2011 ExitCare Patient Information 2015 [place], [place]. This information is not intended to replace advice given to you by your health care provider. Make sure you discuss any questions you have with your health care provider.  Dizziness Dizziness is a common problem. It is a feeling of unsteadiness or  light-headedness. You may feel like you are about to faint. Dizziness can lead to injury if you stumble or fall. A person of any age group can suffer from dizziness, but dizziness is more common in older adults. CAUSES  Dizziness can be caused by many different things, including:  Middle ear problems.  Standing for too long.  Infections.  An allergic reaction.  Aging.  An emotional response to something, such as the sight of blood.  Side effects of medicines.  Tiredness.  Problems with circulation or blood pressure.  Excessive use of alcohol or medicines, or illegal drug use.  Breathing too fast (hyperventilation).  An irregular heart rhythm (arrhythmia).  A low red blood cell count (anemia).  Pregnancy.  Vomiting, diarrhea, fever, or other illnesses that cause body fluid loss (dehydration).  Diseases or conditions such as Parkinson's disease, high blood pressure (hypertension), diabetes, and thyroid problems.  Exposure to extreme heat. DIAGNOSIS  Your health care provider will ask about your symptoms, perform a physical exam, and perform an electrocardiogram (ECG) to record the electrical activity of your heart. Your health care provider may also perform other heart or blood tests to determine the cause of your dizziness. These may include:  Transthoracic echocardiogram (TTE). During echocardiography, sound waves are used to evaluate how blood flows through your heart.  Transesophageal echocardiogram (TEE).  Cardiac monitoring. This allows your health care provider to monitor your heart rate and rhythm in real time.  Holter monitor. This is a portable device that records your heartbeat and can help diagnose heart arrhythmias. It allows your health care provider to track your heart activity for several days if needed.  Stress tests by exercise or by giving medicine that makes the heart beat faster. TREATMENT  Treatment of dizziness depends on the cause of your  symptoms and can vary greatly. HOME CARE INSTRUCTIONS   Drink enough fluids to keep your urine clear or pale yellow. This is especially important in very hot weather. In older adults, it is also important in cold weather.  Take your medicine exactly as directed if your dizziness is caused by medicines. When taking blood pressure medicines, it is especially important to get up slowly.  Rise slowly from chairs and steady yourself until you feel okay.  In the morning, first sit up on the side of the bed. When you feel okay, stand slowly while holding onto something until you know your balance is fine.  Move your legs often if you need to stand in one place for a long time. Tighten and relax your muscles in your legs while standing.  Have someone stay with you for 1-2 days if dizziness continues to be a problem. Do this until you feel you are well enough to stay alone. Have the person call your health care provider if he or she notices changes in you that are concerning.  Do not drive or use heavy machinery if you feel dizzy.  Do not drink alcohol. SEEK IMMEDIATE MEDICAL CARE IF:   Your dizziness or light-headedness gets worse.  You feel nauseous or vomit.  You have problems talking, walking, or using your arms, hands, or legs.  You feel weak.  You are not  thinking clearly or you have trouble forming sentences. It may take a friend or family member to notice this.  You have chest pain, abdominal pain, shortness of breath, or sweating.  Your vision changes.  You notice any bleeding.  You have side effects from medicine that seems to be getting worse rather than better. MAKE SURE YOU:   Understand these instructions.  Will watch your condition.  Will get help right away if you are not doing well or get worse. Document Released: 05/07/2001 Document Revised: 11/16/2013 Document Reviewed: 05/31/2011 [facility] Patient Information 2015 [place], [place]. This information is not  intended to replace advice given to you by your health care provider. Make sure you discuss any questions you have with your health care provider.  General Headache Without Cause A headache is pain or discomfort felt around the head or neck area. The specific cause of a headache may not be found. There are many causes and types of headaches. A few common ones are:  Tension headaches.  Migraine headaches.  Cluster headaches.  Chronic daily headaches. HOME CARE INSTRUCTIONS   Keep all follow-up appointments with your caregiver or any specialist referral.  Only take over-the-counter or prescription medicines for pain or discomfort as directed by your caregiver.  Lie down in a dark, quiet room when you have a headache.  Keep a headache journal to find out what may trigger your migraine headaches. For example, write down:  What you eat and drink.  How much sleep you get.  Any change to your diet or medicines.  Try massage or other relaxation techniques.  Put ice packs or heat on the head and neck. Use these 3 to 4 times per day for 15 to 20 minutes each time, or as needed.  Limit stress.  Sit up straight, and do not tense your muscles.  Quit smoking if you smoke.  Limit alcohol use.  Decrease the amount of caffeine you drink, or stop drinking caffeine.  Eat and sleep on a regular schedule.  Get 7 to 9 hours of sleep, or as recommended by your caregiver.  Keep lights dim if bright lights bother you and make your headaches worse. SEEK MEDICAL CARE IF:   You have problems with the medicines you were prescribed.  Your medicines are not working.  You have a change from the usual headache.  You have nausea or vomiting. SEEK IMMEDIATE MEDICAL CARE IF:   Your headache becomes severe.  You have a fever.  You have a stiff neck.  You have loss of vision.  You have muscular weakness or loss of muscle control.  You start losing your balance or have trouble  walking.  You feel faint or pass out.  You have severe symptoms that are different from your first symptoms. MAKE SURE YOU:   Understand these instructions.  Will watch your condition.  Will get help right away if you are not doing well or get worse. Document Released: 11/11/2005 Document Revised: 02/03/2012 Document Reviewed: 11/27/2011 [facility] Patient Information 2015 [place], [place]. This information is not intended to replace advice given to you by your health care provider. Make sure you discuss any questions you have with your health care provider.   Emergency Department Resource Guide 1) Find a Doctor and Pay Out of Pocket Although you won't have to find out who is covered by your insurance plan, it is a good idea to ask around and get recommendations. You will then need to call the office and see if  the doctor you have chosen will accept you as a new patient and what types of options they offer for patients who are self-pay. Some doctors offer discounts or will set up payment plans for their patients who do not have insurance, but you will need to ask so you aren't surprised when you get to your appointment.  2) Contact Your Local Health Department Not all health departments have doctors that can see patients for sick visits, but many do, so it is worth a call to see if yours does. If you don't know where your local health department is, you can check in your phone book. The CDC also has a tool to help you locate your state's health department, and many state websites also have listings of all of their local health departments.  3) Find a Walk-in Clinic If your illness is not likely to be very severe or complicated, you may want to try a walk in clinic. These are popping up all over the country in pharmacies, drugstores, and shopping centers. They're usually staffed by nurse practitioners or physician assistants that have been trained to treat common illnesses and complaints. They're  usually fairly quick and inexpensive. However, if you have serious medical issues or chronic medical problems, these are probably not your best option.  No Primary Care Doctor: - Call Health Connect at  [phone] - they can help you locate a primary care doctor that  accepts your insurance, provides certain services, etc. - Physician Referral Service- [phone]  Chronic Pain Problems: Organization         Address  Phone   Notes  [facility]  [phone] Patients need to be referred by their primary care doctor.   Medication Assistance: Organization         Address  Phone   Notes  [facility] Medication [facility] [address] [place], [place] [address] [phone] --Must be a resident of [facility] [place] -- Must have NO insurance coverage whatsoever (no Medicaid/ Medicare, etc.) -- The pt. MUST have a primary care doctor that directs their care regularly and follows them in the community   MedAssist  [phone]   [employer]  [phone]    Agencies that provide inexpensive medical care: Organization         Address  Phone   Notes  [facility]  [phone]) [phone]   [facility]    [phone]) [phone]   [facility] [address] [place], [place] [address] [phone]   [facility] [place] [address], [place] [phone]   Planned Parenthood    [phone]   [facility]    [phone]   Community Health and [facility]  [address], [place] Phone:  [phone], Fax:  [phone] Hours of Operation:  9 am - 6 pm, M-F.  Also accepts Medicaid/Medicare and self-pay.  [facility]  [address], [place] Phone: [phone], Fax: [phone]) [phone]. Hours of Operation:  8:30 am - 5:30 pm, M-F.  Also accepts Medicaid and self-pay.  [facility] [place] [address], [place] Phone:  [phone]   [facility] [address], [place], [place] [phone], Ext. [phone] Mondays & Thursdays: 7-9 AM.  First 15 patients are seen on a first come, first serve basis.    Medicaid-accepting [facility] Providers:  Organization         Address  Phone   Notes  [facility] [address], [place] [phone]) [phone] Also accepts self-pay patients.  [facility] [address], [place]  [phone]) [phone]   [facility] [address], [place] [phone]) [phone]   [facility] [address] [place] [address], [place] [phone]   Renaye RakersVeita Bland [address], [place]   [phone]) [phone] Only accepts [place] Access [place] patients after they have their name applied to their card.   Self-Pay (no insurance) in [facility] [place]:  Organization         Address  Phone   Notes  Sickle Cell Patients, [facility] [address], [place] [phone]   [facility] [address], [place] [phone]) [phone]   [facility] [place]  [address], [place] [phone]) [phone]   [facility]/Dr. Osei-Bonsu  [address] [place] [address], [place] or [phone], [place] [phone]) [phone] Phone number for both [place] and [place] locations is the same.  Urgent Medical and [facility] [address], [place] [phone]) [phone]   [facility] [address] [place] [address], [place] or [address] [phone]) [phone]   [facility] [address], [place] [phone]) [phone], phone; [phone]) [phone], fax Sees patients 1st and 3rd Saturday of every month.  Must not qualify for public or private insurance (i.e. Medicaid, Medicare, [place] Health Choice, Veterans' Benefits)  Household income should be no more than 200% of the poverty level The clinic cannot treat  you if you are pregnant or think you are pregnant  Sexually transmitted diseases are not treated at the clinic.    Dental Care: Organization         Address  Phone  Notes  [facility] Department of [facility] [address], [place] [phone]) [phone] Accepts children up to age 28 who are enrolled in [place] or [place] Health Choice; pregnant women with a Medicaid card; and children who have applied for Medicaid or [place] Health Choice, but were declined, whose parents can pay a reduced fee at time of service.  [facility] of [facility] [place]  [address], [place] [phone] Accepts children up to age 28 who are enrolled in [place] or [place] Health Choice; pregnant women with a Medicaid card; and children who have applied for Medicaid or [place] Health Choice, but were declined, whose parents can pay a reduced fee at time of service.  [facility]  [address], [place] [phone]) [phone] Patients are seen by appointment only. Walk-ins are not accepted. [facility] will see patients 28 years of age and older. Monday - Tuesday (8am-5pm) Most Wednesdays (8:30-5pm) $30 per visit, cash only  [facility]  [address], [facility] [phone]) [phone] Patients are seen by appointment only. Walk-ins are not accepted. [facility] will see patients 28 years of age and older. One Wednesday Evening (Monthly: Volunteer Based).  $30 per visit, cash only  [employer] [facility] [facility]  [phone] for adults; Children under age 694, call Graduate Pediatric Dentistry at [phone]. Children aged 364-14, please call [phone] to request a pediatric application.  Dental services are  provided in all areas of dental care including fillings, crowns and bridges, complete and partial dentures, implants, gum treatment, root canals, and extractions. Preventive care is also provided. Treatment  is provided to both adults and children. Patients are selected via a lottery and there is often a waiting list.   [facility] [address] [place]  [phone] [email or web address]   [facility] [address] [place], [place] [phone], Ext. [phone] Second and Fourth Thursday of each month, opens at 6:30 AM; Clinic ends at 9 AM.  Patients are seen on a first-come first-served basis, and a limited number are seen during each clinic.   [facility]  [address] [place], [place] [phone]   Eligibility Requirements You must have lived in [place], [place], or [place] counties for at least the last three months.   You cannot be eligible for state or federal sponsored [place], including [employer], [place], or [employer].   You generally cannot be eligible for healthcare insurance through your employer.    How to apply: Eligibility screenings are held every Tuesday and Wednesday afternoon from 1:00 pm until 4:00 pm. You do not need an appointment for the interview!  [facility] [address] [place], [place] [phone]   [facility] Health Department  [phone]   [facility] Health Department  [phone]   [facility] Health Department  [phone]    Behavioral Health Resources in the Community: Intensive Outpatient Programs Organization         Address  Phone  Notes  [facility] [address] [place], [place] [phone]   [facility]patient [address] [place], [place] [phone]   [facility] [address] [place], [place]  [phone]   [facility] [address]  [place], [place] [phone] or [phone]   Substance Abuse Resources Organization         Address  Phone  Notes  Alcohol [facility] Drug Services  [phone]   [facility]  [phone]   The  [place]  [phone]   Floydene Flock  [phone]   Residential & Outpatient Substance Abuse Program  [phone]   Psychological Services Organization         Address  Phone  Notes  [facility]  [phone]   [facility] Services  [phone]   [facility] Mental Health [address] [place] [phone] or [phone]    Mobile Crisis Teams Organization         Address  Phone  Notes  [facility], Mobile Crisis Care Unit  [phone]   [facility]  [address] [place], [place] [phone]   Doristine Locks [address] [place] [phone]    Self-Help/Support Groups Organization         Address  Phone             Notes  [facility] [place] - variety of support groups  [phone]- [phone] Call for more information  Narcotics Anonymous (NA), [facility] [address], [employer]   2 meetings at this location   Statistician         Address  Phone  Notes  [facility] [address],    [place]  [phone]   [facility]  [address] [place] [address], [place], [place] [phone]  [facility] [address] [place], [place] [phone] Admissions: 8am-3pm M-F  [facility] [address],    [place], [place] [phone]   [facility] [address] [place], [place], [place] [phone]   [facility] [address],  [place], [place] [phone]   [facility] - Intensive Outpatient [address], [place], [place] [phone]   [facility] ([facility]iction [facility] [address]  [place], [place] [phone] or [phone]   [facility] (RTS) [address] [place], [place] [phone] Accepts Medicaid  [facility] [place] [address],  [place] [phone] Substance Abuse/Addiction Treatment     [facility] Organization         Address  Phone  Notes  [facility]  [phone]   Angie Fava, PhD [address] [place], [place]   [phone] or [phone]   [facility]   [address] [place], [place] [phone]   [facility] [address], [place], [place] [phone] Insurance/Medicaid/sponsorship through [facility] and Families [address]                                    [place], [place] [phone] Therapy/tele-psych/case  [facility] [address], [place] [phone]    Dr. Lolly Mustache  [phone]   [facility]  [employer] [facility] Dept. 1) [address] [place] 2) [address] [place] 3)  [address], [place] [phone]  [phone]   [facility] Child Abuse Hotline [phone] or [phone] (After Hours)

## 2014-05-12 NOTE — ED Notes (Signed)
Pt given urinal for sample 

## 2014-05-22 ENCOUNTER — Emergency Department (HOSPITAL_COMMUNITY)
Admission: EM | Admit: 2014-05-22 | Discharge: 2014-05-22 | Disposition: A | Discharge status: Home / Self Care | Payer: Self-pay | Attending: Emergency Medicine | Admitting: Emergency Medicine

## 2014-05-22 ENCOUNTER — Encounter (HOSPITAL_COMMUNITY): Payer: Self-pay | Admitting: Emergency Medicine

## 2014-05-22 DIAGNOSIS — L03012 Cellulitis of left finger: Secondary | ICD-10-CM

## 2014-05-22 DIAGNOSIS — L03019 Cellulitis of unspecified finger: Secondary | ICD-10-CM | POA: Insufficient documentation

## 2014-05-22 MED ORDER — LIDOCAINE HCL (PF) 2 % IJ SOLN: 10.0000 mL | Freq: Once | INTRAMUSCULAR | Status: DC

## 2014-05-22 MED ORDER — IBUPROFEN 800 MG PO TABS: 800.0000 mg | ORAL_TABLET | Freq: Three times a day (TID) | ORAL | Status: DC | PRN

## 2014-05-22 MED ORDER — CEPHALEXIN 500 MG PO CAPS: 500.0000 mg | ORAL_CAPSULE | Freq: Four times a day (QID) | ORAL | Status: DC

## 2014-05-22 NOTE — Discharge Instructions (Signed)
Keep the area clean and dry.  Return here as needed. 

## 2014-05-22 NOTE — ED Notes (Addendum)
Pt reports he woke up this morning and noticed his left thumb was swollen and painful. Pt able to bend thumb, cap refill <3. Denies injury/trauma.

## 2014-05-22 NOTE — ED Notes (Signed)
The pt has pain and swelling in his lt thumb surrounding the cuticle.  No lesion seen.  He bites his nails. 203 days

## 2014-05-22 NOTE — ED Provider Notes (Signed)
CSN: [id number]     Arrival date & time 05/22/14  0544 History   First MD Initiated Contact with Patient 05/22/14 0600     Chief Complaint  Patient presents with  . Hand Pain     (Consider location/radiation/quality/duration/timing/severity/associated sxs/prior Treatment) HPI Patient presents to the emergency department with left thumb pain.  Patient, states he noticed swelling and redness just below the nail on his left thumb.  Patient, states he has not had any injury, but he does state that he bites his nails and fingers.  Patient denies fevers, nausea, vomiting, diarrhea, weakness, dizziness, or syncope.  He states he didn't take any medications prior to arrival History reviewed. No pertinent past medical history. Past Surgical History  Procedure Laterality Date  . Knee surgery    . Ankle fracture surgery     No family history on file. History  Substance Use Topics  . Smoking status: Never Smoker   . Smokeless tobacco: Not on file  . Alcohol Use: No    Review of Systems  All other systems negative except as documented in the HPI. All pertinent positives and negatives as reviewed in the HPI.  Allergies  Review of patient's allergies indicates no known allergies.  Home Medications   Prior to Admission medications   Not on File   BP 133/79  Pulse 74  Temp(Src) 98.2 F (36.8 C) (Oral)  Resp 18  Ht 5\' 7"  (1.702 m)  Wt 230 lb (104.327 kg)  BMI 36.01 kg/m2  SpO2 98% Physical Exam  Nursing note and vitals reviewed. Constitutional: He is oriented to person, place, and time. He appears well-developed and well-nourished. No distress.  HENT:  Head: Normocephalic and atraumatic.  Pulmonary/Chest: Effort normal.  Musculoskeletal:       Left hand: He exhibits tenderness. He exhibits normal range of motion, normal capillary refill and no deformity. Normal sensation noted.       Hands: Neurological: He is alert and oriented to person, place, and time.  Skin: Skin is warm  and dry.    ED Course  INCISION AND DRAINAGE Date/Time: 05/22/2014 7:22 AM Performed by: Carlyle DollyLAWYER, Aolanis Crispen W Authorized by: Carlyle DollyLAWYER, Yasaman Kolek W Consent: Verbal consent obtained. written consent not obtained. Risks and benefits: risks, benefits and alternatives were discussed Consent given by: patient Patient understanding: patient states understanding of the procedure being performed Patient consent: the patient's understanding of the procedure matches consent given Procedure consent: procedure consent matches procedure scheduled Patient identity confirmed: verbally with patient Indications for incision and drainage: Paronychia. Body area: upper extremity Location details: left thumb Anesthesia: local infiltration and digital block Local anesthetic: lidocaine 2% without epinephrine Anesthetic total: 6 ml Scalpel size: 11 Incision type: single straight Complexity: complex Drainage: purulent Drainage amount: moderate Wound treatment: wound left open Packing material: none Patient tolerance: Patient tolerated the procedure well with no immediate complications.   patient had a small paronychia at the base of his nail laterally on the left thumb.  The patient is advised to keep area clean and dry.  To return here as needed     Carlyle DollyChristopher W Lynwood Kubisiak, PA-C 05/22/14 0732

## 2014-06-06 NOTE — ED Provider Notes (Signed)
Medical screening examination/treatment/procedure(s) were performed by non-physician practitioner and as supervising physician I was immediately available for consultation/collaboration.   EKG Interpretation None        Brandt LoosenJulie Ladaysha Soutar, MD 06/06/14 [phone]

## 2014-07-12 ENCOUNTER — Emergency Department (HOSPITAL_COMMUNITY)
Admission: EM | Admit: 2014-07-12 | Discharge: 2014-07-12 | Disposition: A | Discharge status: Home / Self Care | Payer: Self-pay | Attending: Emergency Medicine | Admitting: Emergency Medicine

## 2014-07-12 ENCOUNTER — Encounter (HOSPITAL_COMMUNITY): Payer: Self-pay | Admitting: Emergency Medicine

## 2014-07-12 DIAGNOSIS — R519 Headache, unspecified: Secondary | ICD-10-CM

## 2014-07-12 DIAGNOSIS — R51 Headache: Secondary | ICD-10-CM | POA: Insufficient documentation

## 2014-07-12 DIAGNOSIS — E860 Dehydration: Secondary | ICD-10-CM | POA: Insufficient documentation

## 2014-07-12 LAB — URINALYSIS, ROUTINE W REFLEX MICROSCOPIC
BILIRUBIN URINE: NEGATIVE
Glucose, UA: NEGATIVE mg/dL
HGB URINE DIPSTICK: NEGATIVE
Ketones, ur: NEGATIVE mg/dL
Leukocytes, UA: NEGATIVE
Nitrite: NEGATIVE
PROTEIN: NEGATIVE mg/dL
Specific Gravity, Urine: 1.01 (ref 1.005–1.030)
UROBILINOGEN UA: 0.2 mg/dL (ref 0.0–1.0)
pH: 7 (ref 5.0–8.0)

## 2014-07-12 LAB — BASIC METABOLIC PANEL
Anion gap: 12 (ref 5–15)
BUN: 11 mg/dL (ref 6–23)
CALCIUM: 9 mg/dL (ref 8.4–10.5)
CO2: 23 mEq/L (ref 19–32)
CREATININE: 0.73 mg/dL (ref 0.50–1.35)
Chloride: 105 mEq/L (ref 96–112)
Glucose, Bld: 101 mg/dL — ABNORMAL HIGH (ref 70–99)
Potassium: 3.8 mEq/L (ref 3.7–5.3)
Sodium: 140 mEq/L (ref 137–147)

## 2014-07-12 LAB — CK: Total CK: 203 U/L (ref 7–232)

## 2014-07-12 MED ORDER — ACETAMINOPHEN 500 MG PO TABS
1000.0000 mg | ORAL_TABLET | Freq: Once | ORAL | Status: AC
  Administered 2014-07-12: 1000 mg via ORAL
  Filled 2014-07-12: qty 2

## 2014-07-12 MED ORDER — SODIUM CHLORIDE 0.9 % IV BOLUS (SEPSIS)
1000.0000 mL | Freq: Once | INTRAVENOUS | Status: AC
  Administered 2014-07-12: 1000 mL via INTRAVENOUS

## 2014-07-12 NOTE — ED Notes (Signed)
Dr. Jeraldine LootsLockwood at the bedside with this RN.

## 2014-07-12 NOTE — ED Notes (Signed)
Pt attempting to use the urinal.

## 2014-07-12 NOTE — ED Provider Notes (Signed)
CSN: [id number]     Arrival date & time 07/12/14  5284 History   First MD Initiated Contact with Patient 07/12/14 [phone]     Chief Complaint  Patient presents with  . Fatigue  . Headache     HPI  Patient presents with concerns of fatigue, weakness, headache. Symptoms began gradually yesterday, since onset has become more severe.  The headache is diffuse, sore, throbbing.  No clear alleviating or exacerbating factors. Patient also notes oliguria, denies dysuria or hematuria. Patient was generally well until yesterday. 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   History reviewed. No pertinent past medical history. Past Surgical History  Procedure Laterality Date  . Knee surgery    . Ankle fracture surgery     History reviewed. No pertinent family history. History  Substance Use Topics  . Smoking status: Never Smoker   . Smokeless tobacco: Not on file  . Alcohol Use: No    Review of Systems  Constitutional:       Per HPI, otherwise negative  HENT:       Per HPI, otherwise negative  Respiratory:       Per HPI, otherwise negative  Cardiovascular:       Per HPI, otherwise negative  Gastrointestinal: Negative for vomiting.  Endocrine:       Negative aside from HPI  Genitourinary:       Neg aside from HPI   Musculoskeletal:       Per HPI, otherwise negative  Skin: Negative.   Neurological: Negative for syncope.      Allergies  Review of patient's allergies indicates no known allergies.  Home Medications   Prior to Admission medications   Medication Sig Start Date End Date Taking? Authorizing Provider  ibuprofen (ADVIL,MOTRIN) 200 MG tablet Take 400 mg by mouth every 6 (six) hours as needed for headache.   Yes Historical Provider, MD   BP 121/64  Temp(Src) 98.1 F (36.7 C) (Oral)  Resp 18  Ht  (1.702 m)  Wt 230 lb (104.327 kg)  BMI 36.01 kg/m2  SpO2 97% Physical Exam  Nursing note and vitals  reviewed. Constitutional: He is oriented to person, place, and time. He appears well-developed. No distress.  HENT:  Head: Normocephalic and atraumatic.  Eyes: Conjunctivae and EOM are normal.  Cardiovascular: Normal rate and regular rhythm.   Pulmonary/Chest: Effort normal. No stridor. No respiratory distress.  Abdominal: He exhibits no distension.  Musculoskeletal: He exhibits no edema.  Neurological: He is alert and oriented to person, place, and time.  Skin: Skin is warm and dry.  Psychiatric: He has a normal mood and affect.    ED Course  Procedures (including critical care time) Labs Review Labs Reviewed  BASIC METABOLIC PANEL - Abnormal; Notable for the following:    Glucose, Bld 101 (*)    All other components within normal limits  URINALYSIS, ROUTINE W REFLEX MICROSCOPIC  CK   10:52 AM Patient states that he feels substantially better, has no active complaints.  We discussed return precautions, follow instructions.  MDM  Patient presents with generalized discomfort, headache.  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 With the patient's description of concurrent oliguria, there is concern for dehydration versus early rhabdomyolysis.  With 2 L of fluid resuscitation, patient told entirely better, was discharged in stable condition. Absent distress, decompensation, abnormal vitals, there is low suspicion for occult acute pathology.   Gerhard Munch,  MD 07/12/14 1053

## 2014-07-12 NOTE — Discharge Instructions (Signed)
As discussed, it is important to monitor your condition carefully, drink plenty of fluids, and get plenty of rest.  Return here for concerning changes in your condition.

## 2014-07-12 NOTE — ED Notes (Signed)
Patient here with complaint of general fatigue and weakness, headache, and bilateral flank pain. Denies fevers. States he feels dehydrated. 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 States that he has not been urinating much and he didn't get much hydration yesterday.

## 2014-08-02 ENCOUNTER — Emergency Department (HOSPITAL_COMMUNITY)
Admission: EM | Admit: 2014-08-02 | Discharge: 2014-08-02 | Disposition: A | Discharge status: Home / Self Care | Payer: Self-pay | Attending: Emergency Medicine | Admitting: Emergency Medicine

## 2014-08-02 ENCOUNTER — Emergency Department (HOSPITAL_COMMUNITY): Payer: Self-pay

## 2014-08-02 ENCOUNTER — Encounter (HOSPITAL_COMMUNITY): Payer: Self-pay | Admitting: Emergency Medicine

## 2014-08-02 DIAGNOSIS — R112 Nausea with vomiting, unspecified: Secondary | ICD-10-CM | POA: Insufficient documentation

## 2014-08-02 DIAGNOSIS — R197 Diarrhea, unspecified: Secondary | ICD-10-CM | POA: Insufficient documentation

## 2014-08-02 DIAGNOSIS — R1011 Right upper quadrant pain: Secondary | ICD-10-CM | POA: Insufficient documentation

## 2014-08-02 DIAGNOSIS — R109 Unspecified abdominal pain: Secondary | ICD-10-CM

## 2014-08-02 LAB — CBC WITH DIFFERENTIAL/PLATELET
Basophils Absolute: 0 10*3/uL (ref 0.0–0.1)
Basophils Relative: 0 % (ref 0–1)
EOS ABS: 0.2 10*3/uL (ref 0.0–0.7)
Eosinophils Relative: 2 % (ref 0–5)
HCT: 47 % (ref 39.0–52.0)
Hemoglobin: 16.4 g/dL (ref 13.0–17.0)
Lymphocytes Relative: 12 % (ref 12–46)
Lymphs Abs: 1.3 10*3/uL (ref 0.7–4.0)
MCH: 30.9 pg (ref 26.0–34.0)
MCHC: 34.9 g/dL (ref 30.0–36.0)
MCV: 88.5 fL (ref 78.0–100.0)
MONO ABS: 1 10*3/uL (ref 0.1–1.0)
Monocytes Relative: 9 % (ref 3–12)
NEUTROS PCT: 77 % (ref 43–77)
Neutro Abs: 8.7 10*3/uL — ABNORMAL HIGH (ref 1.7–7.7)
Platelets: ADEQUATE 10*3/uL (ref 150–400)
RBC: 5.31 MIL/uL (ref 4.22–5.81)
RDW: 12.8 % (ref 11.5–15.5)
WBC: 11.2 10*3/uL — ABNORMAL HIGH (ref 4.0–10.5)

## 2014-08-02 LAB — COMPREHENSIVE METABOLIC PANEL
ALT: 26 U/L (ref 0–53)
AST: 20 U/L (ref 0–37)
Albumin: 4.2 g/dL (ref 3.5–5.2)
Alkaline Phosphatase: 64 U/L (ref 39–117)
Anion gap: 14 (ref 5–15)
BUN: 14 mg/dL (ref 6–23)
CO2: 22 mEq/L (ref 19–32)
Calcium: 9.2 mg/dL (ref 8.4–10.5)
Chloride: 102 mEq/L (ref 96–112)
Creatinine, Ser: 0.84 mg/dL (ref 0.50–1.35)
GFR calc Af Amer: >90 mL/min (ref >90)
GFR calc non Af Amer: >90 mL/min (ref >90)
Glucose, Bld: 100 mg/dL — ABNORMAL HIGH (ref 70–99)
Potassium: 3.7 mEq/L (ref 3.7–5.3)
SODIUM: 138 meq/L (ref 137–147)
TOTAL PROTEIN: 7.6 g/dL (ref 6.0–8.3)
Total Bilirubin: 0.7 mg/dL (ref 0.3–1.2)

## 2014-08-02 LAB — LIPASE, BLOOD: Lipase: 19 U/L (ref 11–59)

## 2014-08-02 MED ORDER — MORPHINE SULFATE 4 MG/ML IJ SOLN
4.0000 mg | Freq: Once | INTRAMUSCULAR | Status: AC
  Administered 2014-08-02: 4 mg via INTRAVENOUS
  Filled 2014-08-02: qty 1

## 2014-08-02 MED ORDER — ONDANSETRON HCL 4 MG PO TABS
4.0000 mg | ORAL_TABLET | Freq: Four times a day (QID) | ORAL | Status: DC
Start: 2014-08-02 — End: 2016-07-27

## 2014-08-02 MED ORDER — HYDROCODONE-ACETAMINOPHEN 5-325 MG PO TABS: 1.0000 | ORAL_TABLET | Freq: Four times a day (QID) | ORAL | Status: DC | PRN

## 2014-08-02 MED ORDER — SODIUM CHLORIDE 0.9 % IV BOLUS (SEPSIS)
1000.0000 mL | Freq: Once | INTRAVENOUS | Status: AC
  Administered 2014-08-02: 1000 mL via INTRAVENOUS

## 2014-08-02 MED ORDER — ONDANSETRON HCL 4 MG/2ML IJ SOLN
4.0000 mg | Freq: Once | INTRAMUSCULAR | Status: AC
  Administered 2014-08-02: 4 mg via INTRAVENOUS
  Filled 2014-08-02: qty 2

## 2014-08-02 NOTE — ED Provider Notes (Signed)
CSN: [id number]     Arrival date & time 08/02/14  4098 History   First MD Initiated Contact with Patient 08/02/14 [phone]     Chief Complaint  Patient presents with  . Abdominal Pain  . Emesis     (Consider location/radiation/quality/duration/timing/severity/associated sxs/prior Treatment) HPI Comments: Patient presents to the emergency department with chief complaint of abdominal pain. He states that he had sudden onset right upper quadrant pain that awoke him from sleep. He states his pain was 10 out of 10. He reports associated nausea, vomiting, diarrhea. He denies any history of abdominal surgeries. He has not tried taking anything to alleviate his symptoms. He denies any hematemesis, melena, or hematochezia. There are no aggravating or alleviating factors. He denies fevers, chills, chest pain, shortness of breath. He denies any other health problems.  The history is provided by the patient. No language interpreter was used.    History reviewed. No pertinent past medical history. Past Surgical History  Procedure Laterality Date  . Knee surgery    . Ankle fracture surgery     History reviewed. No pertinent family history. History  Substance Use Topics  . Smoking status: Never Smoker   . Smokeless tobacco: Not on file  . Alcohol Use: No    Review of Systems  Constitutional: Negative for fever and chills.  Respiratory: Negative for shortness of breath.   Cardiovascular: Negative for chest pain.  Gastrointestinal: Positive for nausea, vomiting, abdominal pain and diarrhea. Negative for constipation.  Genitourinary: Negative for dysuria.  All other systems reviewed and are negative.     Allergies  Review of patient's allergies indicates no known allergies.  Home Medications   Prior to Admission medications   Medication Sig Start Date End Date Taking? Authorizing Provider  acetaminophen (TYLENOL) 500 MG tablet Take 1,000 mg by mouth daily as needed for headache.   Yes  Historical Provider, MD   BP 102/71  Pulse 94  Temp(Src) 97.9 F (36.6 C) (Oral)  Resp 16  Ht  (1.702 m)  Wt 220 lb (99.791 kg)  BMI 34.45 kg/m2  SpO2 98% Physical Exam  Nursing note and vitals reviewed. Constitutional: He is oriented to person, place, and time. He appears well-developed and well-nourished.  HENT:  Head: Normocephalic and atraumatic.  Eyes: Conjunctivae and EOM are normal. Pupils are equal, round, and reactive to light. Right eye exhibits no discharge. Left eye exhibits no discharge. No scleral icterus.  Neck: Normal range of motion. Neck supple. No JVD present.  Cardiovascular: Normal rate, regular rhythm and normal heart sounds.  Exam reveals no gallop and no friction rub.   No murmur heard. Pulmonary/Chest: Effort normal and breath sounds normal. No respiratory distress. He has no wheezes. He has no rales. He exhibits no tenderness.  Abdominal: Soft. He exhibits no distension and no mass. There is tenderness. There is no rebound and no guarding.  Right upper quadrant is tender to palpation, no focal lower abdominal tenderness  Musculoskeletal: Normal range of motion. He exhibits no edema and no tenderness.  Neurological: He is alert and oriented to person, place, and time.  Skin: Skin is warm and dry.  Psychiatric: He has a normal mood and affect. His behavior is normal. Judgment and thought content normal.    ED Course  Procedures (including critical care time) Results for orders placed during the hospital encounter of 08/02/14  CBC WITH DIFFERENTIAL      Result Value Ref Range   WBC 11.2 (*) 4.0 - 10.5  K/uL   RBC 5.31  4.22 - 5.81 MIL/uL   Hemoglobin 16.4  13.0 - 17.0 g/dL   HCT 78.2  95.6 - 21.3 %   MCV 88.5  78.0 - 100.0 fL   MCH 30.9  26.0 - 34.0 pg   MCHC 34.9  30.0 - 36.0 g/dL   RDW 08.6  57.8 - 46.9 %   Platelets    150 - 400 K/uL   Value: PLATELET CLUMPS NOTED ON SMEAR, COUNT APPEARS ADEQUATE   Neutrophils Relative % 77  43 - 77 %    Lymphocytes Relative 12  12 - 46 %   Monocytes Relative 9  3 - 12 %   Eosinophils Relative 2  0 - 5 %   Basophils Relative 0  0 - 1 %   Neutro Abs 8.7 (*) 1.7 - 7.7 K/uL   Lymphs Abs 1.3  0.7 - 4.0 K/uL   Monocytes Absolute 1.0  0.1 - 1.0 K/uL   Eosinophils Absolute 0.2  0.0 - 0.7 K/uL   Basophils Absolute 0.0  0.0 - 0.1 K/uL   Smear Review MORPHOLOGY UNREMARKABLE    COMPREHENSIVE METABOLIC PANEL      Result Value Ref Range   Sodium 138  137 - 147 mEq/L   Potassium 3.7  3.7 - 5.3 mEq/L   Chloride 102  96 - 112 mEq/L   CO2 22  19 - 32 mEq/L   Glucose, Bld 100 (*) 70 - 99 mg/dL   BUN 14  6 - 23 mg/dL   Creatinine, Ser 6.29  0.50 - 1.35 mg/dL   Calcium 9.2  8.4 - 52.8 mg/dL   Total Protein 7.6  6.0 - 8.3 g/dL   Albumin 4.2  3.5 - 5.2 g/dL   AST 20  0 - 37 U/L   ALT 26  0 - 53 U/L   Alkaline Phosphatase 64  39 - 117 U/L   Total Bilirubin 0.7  0.3 - 1.2 mg/dL   GFR calc non Af Amer >90  >90 mL/min   GFR calc Af Amer >90  >90 mL/min   Anion gap 14  5 - 15  LIPASE, BLOOD      Result Value Ref Range   Lipase 19  11 - 59 U/L   US Abdomen Complete  08/02/2014   CLINICAL DATA:  Right upper quadrant pain.  EXAM: ULTRASOUND ABDOMEN COMPLETE  COMPARISON:  CT scan dated 04/26/2005  FINDINGS: Gallbladder:  No gallstones or wall thickening visualized. No sonographic Murphy sign noted.  Common bile duct:  Diameter: 4.8 mm, normal.  Liver:  No focal lesion identified. Within normal limits in parenchymal echogenicity.  IVC:  No abnormality visualized.  Pancreas:  Visualized portion unremarkable.  Spleen:  9.2 cm, normal.  Right Kidney:  Length: 12.0 cm. Echogenicity within normal limits. No mass or hydronephrosis visualized.  Left Kidney:  Length: 11.7 cm. Echogenicity within normal limits. No mass or hydronephrosis visualized.  Abdominal aorta:  Normal.  2.2 cm maximal diameter.  Other findings:  None  IMPRESSION: Normal exam.   Electronically Signed   By: Geanie Cooley M.D.   On: 08/02/2014 13:35       EKG Interpretation None      MDM   Final diagnoses:  Abdominal pain, unspecified abdominal location    Patient with severe right upper quadrant tenderness, with associated nausea, vomiting, diarrhea. Check labs. Patient does not drink alcohol. Check right upper quadrant ultrasound.  3:05 PM Ultrasound was negative.  Patient  is feeling well.  Tolerating oral intake.  DC to home.    Roxy Horseman, PA-C 08/02/14 [phone]

## 2014-08-02 NOTE — Discharge Instructions (Signed)

## 2014-08-02 NOTE — ED Notes (Signed)
Patient stated drinking water does not bother him.

## 2014-08-02 NOTE — ED Provider Notes (Signed)
Medical screening examination/treatment/procedure(s) were performed by non-physician practitioner and as supervising physician I was immediately available for consultation/collaboration.  Flint Melter, MD 08/02/14 1620

## 2014-08-02 NOTE — ED Notes (Signed)
Pt from home with c/o sudden onset 10/10 RUQ/epigastric pain that woke him from sleep this am.  Pt reports emesis x1 this am and diarrhea x 4 days.  Pt ambulatory to room, A&O, NAD.

## 2014-09-28 ENCOUNTER — Emergency Department: Payer: Self-pay | Admitting: Emergency Medicine

## 2014-09-28 LAB — COMPREHENSIVE METABOLIC PANEL
ALT: 30 U/L
ANION GAP: 8 (ref 7–16)
AST: 16 U/L (ref 15–37)
Albumin: 3.9 g/dL (ref 3.4–5.0)
Alkaline Phosphatase: 64 U/L
BUN: 10 mg/dL (ref 7–18)
Bilirubin,Total: 0.7 mg/dL (ref 0.2–1.0)
CALCIUM: 8.4 mg/dL — AB (ref 8.5–10.1)
Chloride: 101 mmol/L (ref 98–107)
Co2: 25 mmol/L (ref 21–32)
Creatinine: 1.09 mg/dL (ref 0.60–1.30)
EGFR (African American): >60
EGFR (Non-African Amer.): >60
Glucose: 121 mg/dL — ABNORMAL HIGH (ref 65–99)
OSMOLALITY: 269 (ref 275–301)
POTASSIUM: 3.4 mmol/L — AB (ref 3.5–5.1)
Sodium: 134 mmol/L — ABNORMAL LOW (ref 136–145)
Total Protein: 7.6 g/dL (ref 6.4–8.2)

## 2014-09-28 LAB — CBC WITH DIFFERENTIAL/PLATELET
BASOS ABS: 0.1 10*3/uL (ref 0.0–0.1)
BASOS PCT: 0.5 %
EOS ABS: 0 10*3/uL (ref 0.0–0.7)
EOS PCT: 0 %
HCT: 45.9 % (ref 40.0–52.0)
HGB: 15.4 g/dL (ref 13.0–18.0)
LYMPHS ABS: 1 10*3/uL (ref 1.0–3.6)
Lymphocyte %: 7.4 %
MCH: 29.2 pg (ref 26.0–34.0)
MCHC: 33.4 g/dL (ref 32.0–36.0)
MCV: 87 fL (ref 80–100)
MONO ABS: 1.2 x10 3/mm — AB (ref 0.2–1.0)
Monocyte %: 8.8 %
NEUTROS PCT: 83.3 %
Neutrophil #: 11.5 10*3/uL — ABNORMAL HIGH (ref 1.4–6.5)
PLATELETS: 201 10*3/uL (ref 150–440)
RBC: 5.26 10*6/uL (ref 4.40–5.90)
RDW: 12.8 % (ref 11.5–14.5)
WBC: 13.8 10*3/uL — ABNORMAL HIGH (ref 3.8–10.6)

## 2014-09-28 LAB — URINALYSIS, COMPLETE
BACTERIA: NONE SEEN
Bilirubin,UR: NEGATIVE
GLUCOSE, UR: NEGATIVE mg/dL (ref 0–75)
Ketone: NEGATIVE
Leukocyte Esterase: NEGATIVE
Nitrite: NEGATIVE
PROTEIN: NEGATIVE
Ph: 6 (ref 4.5–8.0)
RBC, UR: NONE SEEN /HPF (ref 0–5)
Specific Gravity: 1.002 (ref 1.003–1.030)
Squamous Epithelial: NONE SEEN
WBC UR: NONE SEEN /HPF (ref 0–5)

## 2014-12-12 ENCOUNTER — Emergency Department: Payer: Self-pay | Admitting: Emergency Medicine

## 2014-12-29 ENCOUNTER — Emergency Department: Payer: Self-pay | Admitting: Emergency Medicine

## 2015-09-26 ENCOUNTER — Emergency Department
Admission: EM | Admit: 2015-09-26 | Discharge: 2015-09-26 | Disposition: A | Discharge status: Home / Self Care | Payer: Self-pay | Attending: Emergency Medicine | Admitting: Emergency Medicine

## 2015-09-26 ENCOUNTER — Encounter: Payer: Self-pay | Admitting: Emergency Medicine

## 2015-09-26 ENCOUNTER — Emergency Department: Payer: Self-pay

## 2015-09-26 DIAGNOSIS — B349 Viral infection, unspecified: Secondary | ICD-10-CM | POA: Insufficient documentation

## 2015-09-26 DIAGNOSIS — M79602 Pain in left arm: Secondary | ICD-10-CM | POA: Insufficient documentation

## 2015-09-26 DIAGNOSIS — F419 Anxiety disorder, unspecified: Secondary | ICD-10-CM | POA: Insufficient documentation

## 2015-09-26 DIAGNOSIS — R200 Anesthesia of skin: Secondary | ICD-10-CM | POA: Insufficient documentation

## 2015-09-26 DIAGNOSIS — M549 Dorsalgia, unspecified: Secondary | ICD-10-CM | POA: Insufficient documentation

## 2015-09-26 HISTORY — DX: Anxiety disorder, unspecified: F41.9

## 2015-09-26 HISTORY — DX: Panic disorder (episodic paroxysmal anxiety): F41.0

## 2015-09-26 LAB — BASIC METABOLIC PANEL WITH GFR
Anion gap: 5 (ref 5–15)
BUN: 13 mg/dL (ref 6–20)
CO2: 24 mmol/L (ref 22–32)
Calcium: 8.6 mg/dL — ABNORMAL LOW (ref 8.9–10.3)
Chloride: 106 mmol/L (ref 101–111)
Creatinine, Ser: 0.77 mg/dL (ref 0.61–1.24)
GFR calc Af Amer: >60 mL/min
GFR calc non Af Amer: >60 mL/min
Glucose, Bld: 97 mg/dL (ref 65–99)
Potassium: 3.6 mmol/L (ref 3.5–5.1)
Sodium: 135 mmol/L (ref 135–145)

## 2015-09-26 LAB — CBC
HCT: 43.6 % (ref 40.0–52.0)
Hemoglobin: 15.2 g/dL (ref 13.0–18.0)
MCH: 30 pg (ref 26.0–34.0)
MCHC: 34.7 g/dL (ref 32.0–36.0)
MCV: 86.3 fL (ref 80.0–100.0)
Platelets: 203 10*3/uL (ref 150–440)
RBC: 5.06 MIL/uL (ref 4.40–5.90)
RDW: 12.2 % (ref 11.5–14.5)
WBC: 8.8 10*3/uL (ref 3.8–10.6)

## 2015-09-26 LAB — INFLUENZA PANEL BY PCR (TYPE A & B)
H1N1 flu by pcr: NOT DETECTED
INFLBPCR: NEGATIVE
Influenza A By PCR: NEGATIVE

## 2015-09-26 LAB — MONONUCLEOSIS SCREEN: Mono Screen: NEGATIVE

## 2015-09-26 LAB — CK: Total CK: 297 U/L (ref 49–397)

## 2015-09-26 LAB — TROPONIN I: Troponin I: <0.03 ng/mL (ref <0.031)

## 2015-09-26 MED ORDER — SODIUM CHLORIDE 0.9 % IV SOLN
Freq: Once | INTRAVENOUS | Status: AC
  Administered 2015-09-26: 21:00:00 via INTRAVENOUS

## 2015-09-26 MED ORDER — LORAZEPAM 2 MG/ML IJ SOLN: 1.0000 mg | Freq: Once | INTRAMUSCULAR | Status: DC

## 2015-09-26 MED ORDER — KETOROLAC TROMETHAMINE 30 MG/ML IJ SOLN
30.0000 mg | Freq: Once | INTRAMUSCULAR | Status: AC
  Administered 2015-09-26: 30 mg via INTRAVENOUS

## 2015-09-26 MED ORDER — IBUPROFEN 800 MG PO TABS: 800.0000 mg | ORAL_TABLET | Freq: Three times a day (TID) | ORAL | Status: DC | PRN

## 2015-09-26 MED ORDER — KETOROLAC TROMETHAMINE 30 MG/ML IJ SOLN
INTRAMUSCULAR | Status: AC
  Filled 2015-09-26: qty 1

## 2015-09-26 MED ORDER — LORAZEPAM 1 MG PO TABS: 1.0000 mg | ORAL_TABLET | Freq: Two times a day (BID) | ORAL | Status: DC

## 2015-09-26 NOTE — ED Notes (Signed)
Pt ambulatory to bathroom at this time, no acute distress noted.

## 2015-09-26 NOTE — ED Notes (Signed)
MD Williams at bedside.  

## 2015-09-26 NOTE — ED Notes (Signed)
Lab called stating wrong order placed for flu swab, orders changed at this time, lab made aware.

## 2015-09-26 NOTE — ED Notes (Signed)
Pt presents to ED with c/o left arm pain/numbess and fatigue since yesterday; also states he has noticed chest discomfort intermittently during the day but denies currently.  Pt reports hx of the same several years ago and pt was placed on ativan for anxiety after he was evaluated by his pcp. Pt reports nothing seems to increase his pain. Denies sob currently but reports having it several times throughout the day. Denies any anxiety at this time. Pt currently has no increased work of breathing or acute distress noted. Skin warm and dry.

## 2015-09-26 NOTE — Discharge Instructions (Signed)
Panic Attacks °Panic attacks are sudden, short-lived surges of severe anxiety, fear, or discomfort. They may occur for no reason when you are relaxed, when you are anxious, or when you are sleeping. Panic attacks may occur for a number of reasons:  °· Healthy people occasionally have panic attacks in extreme, life-threatening situations, such as war or natural disasters. Normal anxiety is a protective mechanism of the body that helps us react to danger (fight or flight response). °· Panic attacks are often seen with anxiety disorders, such as panic disorder, social anxiety disorder, generalized anxiety disorder, and phobias. Anxiety disorders cause excessive or uncontrollable anxiety. They may interfere with your relationships or other life activities. °· Panic attacks are sometimes seen with other mental illnesses, such as depression and posttraumatic stress disorder. °· Certain medical conditions, prescription medicines, and drugs of abuse can cause panic attacks. °SYMPTOMS  °Panic attacks start suddenly, peak within 20 minutes, and are accompanied by four or more of the following symptoms: °· Pounding heart or fast heart rate (palpitations). °· Sweating. °· Trembling or shaking. °· Shortness of breath or feeling smothered. °· Feeling choked. °· Chest pain or discomfort. °· Nausea or strange feeling in your stomach. °· Dizziness, light-headedness, or feeling like you will faint. °· Chills or hot flushes. °· Numbness or tingling in your lips or hands and feet. °· Feeling that things are not real or feeling that you are not yourself. °· Fear of losing control or going crazy. °· Fear of dying. °Some of these symptoms can mimic serious medical conditions. For example, you may think you are having a heart attack. Although panic attacks can be very scary, they are not life threatening. °DIAGNOSIS  °Panic attacks are diagnosed through an assessment by your health care provider. Your health care provider will ask  questions about your symptoms, such as where and when they occurred. Your health care provider will also ask about your medical history and use of alcohol and drugs, including prescription medicines. Your health care provider may order blood tests or other studies to rule out a serious medical condition. Your health care provider may refer you to a mental health professional for further evaluation. °TREATMENT  °· Most healthy people who have one or two panic attacks in an extreme, life-threatening situation will not require treatment. °· The treatment for panic attacks associated with anxiety disorders or other mental illness typically involves counseling with a mental health professional, medicine, or a combination of both. Your health care provider will help determine what treatment is best for you. °· Panic attacks due to physical illness usually go away with treatment of the illness. If prescription medicine is causing panic attacks, talk with your health care provider about stopping the medicine, decreasing the dose, or substituting another medicine. °· Panic attacks due to alcohol or drug abuse go away with abstinence. Some adults need professional help in order to stop drinking or using drugs. °HOME CARE INSTRUCTIONS  °· Take all medicines as directed by your health care provider.   °· Schedule and attend follow-up visits as directed by your health care provider. It is important to keep all your appointments. °SEEK MEDICAL CARE IF: °· You are not able to take your medicines as prescribed. °· Your symptoms do not improve or get worse. °SEEK IMMEDIATE MEDICAL CARE IF:  °· You experience panic attack symptoms that are different than your usual symptoms. °· You have serious thoughts about hurting yourself or others. °· You are taking medicine for panic attacks and   have a serious side effect. MAKE SURE YOU:  Understand these instructions.  Will watch your condition.  Will get help right away if you are not  doing well or get worse.   This information is not intended to replace advice given to you by your health care provider. Make sure you discuss any questions you have with your health care provider.   Document Released: 11/11/2005 Document Revised: 11/16/2013 Document Reviewed: 06/25/2013 Elsevier Interactive Patient Education 2016 Elsevier Inc. Viral Infections A viral infection can be caused by different types of viruses.Most viral infections are not serious and resolve on their own. However, some infections may cause severe symptoms and may lead to further complications. SYMPTOMS Viruses can frequently cause:  Minor sore throat.  Aches and pains.  Headaches.  Runny nose.  Different types of rashes.  Watery eyes.  Tiredness.  Cough.  Loss of appetite.  Gastrointestinal infections, resulting in nausea, vomiting, and diarrhea. These symptoms do not respond to antibiotics because the infection is not caused by bacteria. However, you might catch a bacterial infection following the viral infection. This is sometimes called a "superinfection." Symptoms of such a bacterial infection may include:  Worsening sore throat with pus and difficulty swallowing.  Swollen neck glands.  Chills and a high or persistent fever.  Severe headache.  Tenderness over the sinuses.  Persistent overall ill feeling (malaise), muscle aches, and tiredness (fatigue).  Persistent cough.  Yellow, green, or [place] mucus production with coughing. HOME CARE INSTRUCTIONS   Only take over-the-counter or prescription medicines for pain, discomfort, diarrhea, or fever as directed by your caregiver.  Drink enough water and fluids to keep your urine clear or pale yellow. Sports drinks can provide valuable electrolytes, sugars, and hydration.  Get plenty of rest and maintain proper nutrition. Soups and broths with crackers or rice are fine. SEEK IMMEDIATE MEDICAL CARE IF:   You have severe headaches,  shortness of breath, chest pain, neck pain, or an unusual rash.  You have uncontrolled vomiting, diarrhea, or you are unable to keep down fluids.  You or your child has an oral temperature above 102 F (38.9 C), not controlled by medicine.  Your baby is older than 3 months with a rectal temperature of 102 F (38.9 C) or higher.  Your baby is 423 months old or younger with a rectal temperature of 100.4 F (38 C) or higher. MAKE SURE YOU:   Understand these instructions.  Will watch your condition.  Will get help right away if you are not doing well or get worse.   This information is not intended to replace advice given to you by your health care provider. Make sure you discuss any questions you have with your health care provider.   Document Released: 08/21/2005 Document Revised: 02/03/2012 Document Reviewed: 04/19/2015 Elsevier Interactive Patient Education [employer] Elsevier Inc.

## 2015-09-26 NOTE — ED Provider Notes (Signed)
[facility] Emergency Department Provider Note     Time seen: ----------------------------------------- 8:28 PM on 09/26/2015 -----------------------------------------    I have reviewed the triage vital signs and the nursing notes.   HISTORY  Chief Complaint Arm Pain; Fatigue; and Numbness    HPI Marvin Case is a 29 y.o. male who presents ER with left arm pain and numbness and fatigue since S-shaped. Patient describes chills and body aches, no sore throat or cough. He has had some chest discomfort during the day but denies this currently. Patient states a history seen several years ago was placed on Ativan for anxiety and is evaluated by his primary care doctor. Patient states nothing makes his symptoms better or worse.   Past Medical History  Diagnosis Date  . Anxiety   . Panic attacks     There are no active problems to display for this patient.   Past Surgical History  Procedure Laterality Date  . Knee surgery    . Ankle fracture surgery    . Knee surgery    . Ankle surgery      Allergies Review of patient's allergies indicates no known allergies.  Social History Social History  Substance Use Topics  . Smoking status: Never Smoker   . Smokeless tobacco: None  . Alcohol Use: No    Review of Systems Constitutional: Negative for fever. Eyes: Negative for visual changes. ENT: Negative for sore throat. Cardiovascular: Positive for chest pain Respiratory: Negative for shortness of breath. Gastrointestinal: Negative for abdominal pain, vomiting, positive for diarrhea Genitourinary: Negative for dysuria. Musculoskeletal: Positive for back pain Skin: Negative for rash. Neurological: Negative for headaches, focal weakness, positive for left arm numbness  10-point ROS otherwise negative.  ____________________________________________   PHYSICAL EXAM:  VITAL SIGNS: ED Triage Vitals  Enc Vitals Group     BP 09/26/15 1956  136/96 mmHg     Pulse Rate 09/26/15 1956 95     Resp 09/26/15 1956 18     Temp 09/26/15 1956 98.1 F (36.7 C)     Temp Source 09/26/15 1956 Oral     SpO2 09/26/15 1956 98 %     Weight 09/26/15 1956 240 lb (108.863 kg)     Height 09/26/15 1956  (1.727 m)     Head Cir --      Peak Flow --      Pain Score 09/26/15 1957 0     Pain Loc --      Pain Edu? --      Excl. in GC? --     Constitutional: Alert and oriented. Well appearing and in no distress. Eyes: Conjunctivae are normal. PERRL. Normal extraocular movements. ENT   Head: Normocephalic and atraumatic.   Nose: No congestion/rhinnorhea.   Mouth/Throat: Mucous membranes are moist.   Neck: No stridor. Cardiovascular: Normal rate, regular rhythm. Normal and symmetric distal pulses are present in all extremities. No murmurs, rubs, or gallops. Respiratory: Normal respiratory effort without tachypnea nor retractions. Breath sounds are clear and equal bilaterally. No wheezes/rales/rhonchi. Gastrointestinal: Soft and nontender. No distention. No abdominal bruits.  Musculoskeletal: Nontender with normal range of motion in all extremities. No joint effusions.  No lower extremity tenderness nor edema. Neurologic:  Normal speech and language. No gross focal neurologic deficits are appreciated. Speech is normal. No gait instability. Skin:  Skin is warm, dry and intact. No rash noted. Psychiatric: Mood and affect are normal. Speech and behavior are normal. Patient exhibits appropriate insight and judgment. ____________________________________________  EKG: Interpreted by me. Sinus tachycardia with rate 102 bpm, normal PR interval, normal QS with, normal QT interval.  ____________________________________________  ED COURSE:  Pertinent labs & imaging results that were available during my care of the patient were reviewed by me and considered in my medical decision making (see chart for  details).  ____________________________________________    LABS (pertinent positives/negatives)  Labs Reviewed  CBC  BASIC METABOLIC PANEL  TROPONIN I  CK  MONONUCLEOSIS SCREEN  INFLUENZA PANEL BY PCR (TYPE A & B, H1N1)    RADIOLOGY  IMPRESSION: No edema or consolidation.  ____________________________________________  FINAL ASSESSMENT AND PLAN  Anxiety  Plan: Patient with labs and imaging as dictated above. No clear etiology for his symptoms other than anxiety. Labs here are unremarkable. He could also be coming down with a viral syndrome. He'll be discharged with Ativan and Motrin to take as directed.   Emily FilbertWilliams, Shawntell Dixson E, MD   [name], MD 09/26/15 [phone]

## 2016-01-16 ENCOUNTER — Encounter (HOSPITAL_COMMUNITY): Payer: Self-pay | Admitting: *Deleted

## 2016-01-16 ENCOUNTER — Emergency Department (HOSPITAL_COMMUNITY)
Admission: EM | Admit: 2016-01-16 | Discharge: 2016-01-16 | Disposition: A | Discharge status: Home / Self Care | Payer: Self-pay | Attending: Emergency Medicine | Admitting: Emergency Medicine

## 2016-01-16 DIAGNOSIS — R112 Nausea with vomiting, unspecified: Secondary | ICD-10-CM | POA: Insufficient documentation

## 2016-01-16 DIAGNOSIS — R6883 Chills (without fever): Secondary | ICD-10-CM | POA: Insufficient documentation

## 2016-01-16 DIAGNOSIS — R51 Headache: Secondary | ICD-10-CM | POA: Insufficient documentation

## 2016-01-16 DIAGNOSIS — M791 Myalgia: Secondary | ICD-10-CM | POA: Insufficient documentation

## 2016-01-16 NOTE — ED Notes (Signed)
Attempted to call pt for reassessment pt did not answer Nurse First aware

## 2016-01-16 NOTE — ED Notes (Signed)
PT is here with headache, vomiting, body aches, and nausea.  No abdominal pain.

## 2016-01-23 ENCOUNTER — Emergency Department
Admission: EM | Admit: 2016-01-23 | Discharge: 2016-01-23 | Disposition: A | Discharge status: Home / Self Care | Payer: Self-pay | Attending: Emergency Medicine | Admitting: Emergency Medicine

## 2016-01-23 ENCOUNTER — Encounter: Payer: Self-pay | Admitting: Emergency Medicine

## 2016-01-23 DIAGNOSIS — Z79899 Other long term (current) drug therapy: Secondary | ICD-10-CM | POA: Insufficient documentation

## 2016-01-23 DIAGNOSIS — H6501 Acute serous otitis media, right ear: Secondary | ICD-10-CM | POA: Insufficient documentation

## 2016-01-23 DIAGNOSIS — I10 Essential (primary) hypertension: Secondary | ICD-10-CM | POA: Insufficient documentation

## 2016-01-23 DIAGNOSIS — R070 Pain in throat: Secondary | ICD-10-CM | POA: Insufficient documentation

## 2016-01-23 DIAGNOSIS — R42 Dizziness and giddiness: Secondary | ICD-10-CM

## 2016-01-23 HISTORY — DX: Essential (primary) hypertension: I10

## 2016-01-23 MED ORDER — MECLIZINE HCL 25 MG PO TABS: 25.0000 mg | ORAL_TABLET | Freq: Three times a day (TID) | ORAL | Status: DC | PRN

## 2016-01-23 NOTE — Discharge Instructions (Signed)
Dizziness Dizziness is a common problem. It makes you feel unsteady or lightheaded. You may feel like you are about to pass out (faint). Dizziness can lead to injury if you stumble or fall. Anyone can get dizzy, but dizziness is more common in older adults. This condition can be caused by a number of things, including:  Medicines.  Dehydration.  Illness. HOME CARE Following these instructions may help with your condition: Eating and Drinking  Drink enough fluid to keep your pee (urine) clear or pale yellow. This helps to keep you from getting dehydrated. Try to drink more clear fluids, such as water.  Do not drink alcohol.  Limit how much caffeine you drink or eat if told by your doctor.  Limit how much salt you drink or eat if told by your doctor. Activity  Avoid making quick movements.  When you stand up from sitting in a chair, steady yourself until you feel okay.  In the morning, first sit up on the side of the bed. When you feel okay, stand slowly while you hold onto something. Do this until you know that your balance is fine.  Move your legs often if you need to stand in one place for a long time. Tighten and relax your muscles in your legs while you are standing.  Do not drive or use heavy machinery if you feel dizzy.  Avoid bending down if you feel dizzy. Place items in your home so that they are easy for you to reach without leaning over. Lifestyle  Do not use any tobacco products, including cigarettes, chewing tobacco, or electronic cigarettes. If you need help quitting, ask your doctor.  Try to lower your stress level, such as with yoga or meditation. Talk with your doctor if you need help. General Instructions  Watch your dizziness for any changes.  Take medicines only as told by your doctor. Talk with your doctor if you think that your dizziness is caused by a medicine that you are taking.  Tell a friend or a family member that you are feeling dizzy. If he or  she notices any changes in your behavior, have this person call your doctor.  Keep all follow-up visits as told by your doctor. This is important. GET HELP IF:  Your dizziness does not go away.  Your dizziness or light-headedness gets worse.  You feel sick to your stomach (nauseous).  You have trouble hearing.  You have new symptoms.  You are unsteady on your feet or you feel like the room is spinning. GET HELP RIGHT AWAY IF:  You throw up (vomit) or have diarrhea and are unable to eat or drink anything.  You have trouble:  Talking.  Walking.  Swallowing.  Using your arms, hands, or legs.  You feel generally weak.  You are not thinking clearly or you have trouble forming sentences. It may take a friend or family member to notice this.  You have:  Chest pain.  Pain in your belly (abdomen).  Shortness of breath.  Sweating.  Your vision changes.  You are bleeding.  You have a headache.  You have neck pain or a stiff neck.  You have a fever.   This information is not intended to replace advice given to you by your health care provider. Make sure you discuss any questions you have with your health care provider.   Document Released: 10/31/2011 Document Revised: 03/28/2015 Document Reviewed: 11/07/2014 Elsevier Interactive Patient Education 2016 [employer]  Otitis Media With Effusion  Otitis media with effusion is the presence of fluid in the middle ear. This is a common problem in children, which often follows ear infections. It may be present for weeks or longer after the infection. Unlike an acute ear infection, otitis media with effusion refers only to fluid behind the ear drum and not infection. Children with repeated ear and sinus infections and allergy problems are the most likely to get otitis media with effusion. CAUSES  The most frequent cause of the fluid buildup is dysfunction of the eustachian tubes. These are the tubes that drain fluid in the  ears to the back of the nose (nasopharynx). SYMPTOMS   The main symptom of this condition is hearing loss. As a result, you or your child may:  Listen to the TV at a loud volume.  Not respond to questions.  Ask "what" often when spoken to.  Mistake or confuse one sound or word for another.  There may be a sensation of fullness or pressure but usually not pain. DIAGNOSIS   Your health care provider will diagnose this condition by examining you or your child's ears.  Your health care provider may test the pressure in you or your child's ear with a tympanometer.  A hearing test may be conducted if the problem persists. TREATMENT   Treatment depends on the duration and the effects of the effusion.  Antibiotics, decongestants, nose drops, and cortisone-type drugs (tablets or nasal spray) may not be helpful.  Children with persistent ear effusions may have delayed language or behavioral problems. Children at risk for developmental delays in hearing, learning, and speech may require referral to a specialist earlier than children not at risk.  You or your child's health care provider may suggest a referral to an ear, nose, and throat surgeon for treatment. The following may help restore normal hearing:  Drainage of fluid.  Placement of ear tubes (tympanostomy tubes).  Removal of adenoids (adenoidectomy). HOME CARE INSTRUCTIONS   Avoid secondhand smoke.  Infants who are breastfed are less likely to have this condition.  Avoid feeding infants while they are lying flat.  Avoid known environmental allergens.  Avoid people who are sick. SEEK MEDICAL CARE IF:   Hearing is not better in 3 months.  Hearing is worse.  Ear pain.  Drainage from the ear.  Dizziness. MAKE SURE YOU:   Understand these instructions.  Will watch your condition.  Will get help right away if you are not doing well or get worse.   This information is not intended to replace advice given to you  by your health care provider. Make sure you discuss any questions you have with your health care provider.   Document Released: 12/19/2004 Document Revised: 12/02/2014 Document Reviewed: 06/08/2013 Elsevier Interactive Patient Education [employer]

## 2016-01-23 NOTE — ED Provider Notes (Signed)
[facility] Emergency Department Provider Note  ____________________________________________  Time seen: Approximately 900 AM  I have reviewed the triage vital signs and the nursing notes.   HISTORY  Chief Complaint Sore Throat and Headache    HPI Marvin Case is a 30 y.o. male with 2 days of headache, right ear pain and intermittent dizziness who is presenting to the emergency department today. He says that he had flulike symptoms last week with headache, vomiting bodyaches and nausea. Says that multiple members of his family were sick with flu. Those symptoms have resolved at this time however his right ear pain and dizziness and headache remained. He says the headache as a 6 or 7 out of 10 at this time. He says it is mostly to his temples. He says that the right ear pain feels like a pressure type pain. He denies any runny nose or cough. Also complaining of sore throat. Says that it is worse on the left side of his throat when he swallows. Denies any fever. Says that he went to [facility] one week ago for evaluation and was waiting for 8 hours and left without being seen.He says that the dizziness gets worse when he bends over.  Past Medical History  Diagnosis Date  . Anxiety   . Panic attacks   . Hypertension     There are no active problems to display for this patient.   Past Surgical History  Procedure Laterality Date  . Knee surgery    . Ankle fracture surgery    . Knee surgery    . Ankle surgery      Current Outpatient Rx  Name  Route  Sig  Dispense  Refill  . acetaminophen (TYLENOL) 500 MG tablet   Oral   Take 1,000 mg by mouth daily as needed for headache.         Marland Kitchen HYDROcodone-acetaminophen (NORCO/VICODIN) 5-325 MG per tablet   Oral   Take 1-2 tablets by mouth every 6 (six) hours as needed. Patient not taking: Reported on 09/26/2015   10 tablet   0   . ibuprofen (ADVIL,MOTRIN) 800 MG tablet   Oral   Take 1 tablet (800  mg total) by mouth every 8 (eight) hours as needed.   30 tablet   0   . LORazepam (ATIVAN) 1 MG tablet   Oral   Take 1 tablet (1 mg total) by mouth 2 (two) times daily.   20 tablet   0   . ondansetron (ZOFRAN) 4 MG tablet   Oral   Take 1 tablet (4 mg total) by mouth every 6 (six) hours. Patient not taking: Reported on 09/26/2015   12 tablet   0     Allergies Review of patient's allergies indicates no known allergies.  No family history on file.  Social History Social History  Substance Use Topics  . Smoking status: Never Smoker   . Smokeless tobacco: None  . Alcohol Use: No    Review of Systems Constitutional: No fever/chills Eyes: No visual changes. ENT: As above Cardiovascular: Denies chest pain. Respiratory: Denies shortness of breath. Gastrointestinal: No abdominal pain.  No nausea, no vomiting.  No diarrhea.  No constipation. Genitourinary: Negative for dysuria. Musculoskeletal: Negative for back pain. Skin: Negative for rash. Neurological: Negative for headaches, focal weakness or numbness.  10-point ROS otherwise negative.  ____________________________________________   PHYSICAL EXAM:  VITAL SIGNS: ED Triage Vitals  Enc Vitals Group     BP 01/23/16 0818  154/82 mmHg     Pulse Rate 01/23/16 0818 98     Resp 01/23/16 0818 18     Temp 01/23/16 0818 98.1 F (36.7 C)     Temp Source 01/23/16 0818 Oral     SpO2 01/23/16 0818 98 %     Weight 01/23/16 0818 250 lb (113.399 kg)     Height 01/23/16 0818  (1.702 m)     Head Cir --      Peak Flow --      Pain Score 01/23/16 0818 7     Pain Loc --      Pain Edu? --      Excl. in GC? --     Constitutional: Alert and oriented. Well appearing and in no acute distress. Eyes: Conjunctivae are normal. PERRL. EOMI. Head: Atraumatic. Right TM with good light reflex and clear but with mild bulging and fluid visualized behind the TM. Nose: No congestion/rhinnorhea. Mouth/Throat: Mucous membranes are moist.   Oropharynx non-erythematous. Neck: No stridor.  No palpable anterior cervical lymphadenopathy. Cardiovascular: Normal rate, regular rhythm. Grossly normal heart sounds.  Good peripheral circulation. Respiratory: Normal respiratory effort.  No retractions. Lungs CTAB. Gastrointestinal: Soft and nontender. No distention. No abdominal bruits. No CVA tenderness. Musculoskeletal: No lower extremity tenderness nor edema.  No joint effusions. Neurologic:  Normal speech and language. No gross focal neurologic deficits are appreciated. No gait instability. Skin:  Skin is warm, dry and intact. No rash noted. Psychiatric: Mood and affect are normal. Speech and behavior are normal.  ____________________________________________   LABS (all labs ordered are listed, but only abnormal results are displayed)  Labs Reviewed - No data to display ____________________________________________  EKG   ____________________________________________  RADIOLOGY   ____________________________________________   PROCEDURES    ____________________________________________   INITIAL IMPRESSION / ASSESSMENT AND PLAN / ED COURSE  Pertinent labs & imaging results that were available during my care of the patient were reviewed by me and considered in my medical decision making (see chart for details).  Patient has been using an allergy medicine at home to relieve his symptoms. I recommended that he use DayQuil and NyQuil for his headache symptoms and for also relief of the ear pain which likely caused by congestion. No objective neurological findings to suggest a central cause for his dizziness. Likely related to the pressure in his right ear. Will prescribe meclizine.CENTOR score of 1.   ____________________________________________   FINAL CLINICAL IMPRESSION(S) / ED DIAGNOSES  Vertigo.  Serous otitis media.      Myrna Blazer, MD 01/23/16 [phone]

## 2016-01-23 NOTE — ED Notes (Signed)
Sore throat, headache, right ear pain for 2 days.

## 2016-02-03 ENCOUNTER — Encounter: Payer: Self-pay | Admitting: Emergency Medicine

## 2016-02-03 DIAGNOSIS — Z79899 Other long term (current) drug therapy: Secondary | ICD-10-CM | POA: Insufficient documentation

## 2016-02-03 DIAGNOSIS — L0211 Cutaneous abscess of neck: Secondary | ICD-10-CM | POA: Insufficient documentation

## 2016-02-03 DIAGNOSIS — I10 Essential (primary) hypertension: Secondary | ICD-10-CM | POA: Insufficient documentation

## 2016-02-03 NOTE — ED Notes (Signed)
Pt presents tonight with abscess to the back of his neck; redness and swelling noted; denies drainage; denies fever; noticed about 4 days ago;

## 2016-02-04 ENCOUNTER — Emergency Department
Admission: EM | Admit: 2016-02-04 | Discharge: 2016-02-04 | Disposition: A | Discharge status: Home / Self Care | Payer: Self-pay | Attending: Emergency Medicine | Admitting: Emergency Medicine

## 2016-02-04 DIAGNOSIS — L0291 Cutaneous abscess, unspecified: Secondary | ICD-10-CM

## 2016-02-04 MED ORDER — LIDOCAINE HCL (PF) 1 % IJ SOLN: 5.0000 mL | Freq: Once | INTRAMUSCULAR | Status: DC

## 2016-02-04 MED ORDER — SULFAMETHOXAZOLE-TRIMETHOPRIM 800-160 MG PO TABS: 1.0000 | ORAL_TABLET | Freq: Two times a day (BID) | ORAL | Status: DC

## 2016-02-04 NOTE — ED Provider Notes (Signed)
[facility] Emergency Department Provider Note    ____________________________________________  Time seen: ~0045  I have reviewed the triage vital signs and the nursing notes.   HISTORY  Chief Complaint Abscess   History limited by: Not Limited   HPI Marvin Case is a 30 y.o. male who presents to the emergency department tonight because of concern for abscess. Patient states that he noticed pain and swelling to the back of his neck which started roughly 4 days ago. It has progressively gotten worse. He has tried "poppin" it without any relief. He denies any associated fevers, nausea or vomiting. No history of abscess or boils. Denies diabetes. Denies immune modulating medication.   Past Medical History  Diagnosis Date  . Anxiety   . Panic attacks   . Hypertension     There are no active problems to display for this patient.   Past Surgical History  Procedure Laterality Date  . Knee surgery    . Ankle fracture surgery    . Knee surgery    . Ankle surgery      Current Outpatient Rx  Name  Route  Sig  Dispense  Refill  . acetaminophen (TYLENOL) 500 MG tablet   Oral   Take 1,000 mg by mouth daily as needed for headache.         Marland Kitchen HYDROcodone-acetaminophen (NORCO/VICODIN) 5-325 MG per tablet   Oral   Take 1-2 tablets by mouth every 6 (six) hours as needed. Patient not taking: Reported on 09/26/2015   10 tablet   0   . ibuprofen (ADVIL,MOTRIN) 800 MG tablet   Oral   Take 1 tablet (800 mg total) by mouth every 8 (eight) hours as needed.   30 tablet   0   . LORazepam (ATIVAN) 1 MG tablet   Oral   Take 1 tablet (1 mg total) by mouth 2 (two) times daily.   20 tablet   0   . meclizine (ANTIVERT) 25 MG tablet   Oral   Take 1 tablet (25 mg total) by mouth 3 (three) times daily as needed for dizziness.   30 tablet   0   . ondansetron (ZOFRAN) 4 MG tablet   Oral   Take 1 tablet (4 mg total) by mouth every 6 (six) hours. Patient  not taking: Reported on 09/26/2015   12 tablet   0     Allergies Review of patient's allergies indicates no known allergies.  History reviewed. No pertinent family history.  Social History Social History  Substance Use Topics  . Smoking status: Never Smoker   . Smokeless tobacco: None  . Alcohol Use: No    Review of Systems  Constitutional: Negative for fever. Cardiovascular: Negative for chest pain. Respiratory: Negative for shortness of breath. Gastrointestinal: Negative for abdominal pain, vomiting and diarrhea. Skin: Swelling and pain to the back of his neck.  Neurological: Negative for headaches, focal weakness or numbness.  10-point ROS otherwise negative.  ____________________________________________   PHYSICAL EXAM:  VITAL SIGNS: ED Triage Vitals  Enc Vitals Group     BP 02/03/16 2343 147/90 mmHg     Pulse Rate 02/03/16 2343 102     Resp 02/03/16 2343 18     Temp 02/03/16 2343 98.1 F (36.7 C)     Temp Source 02/03/16 2343 Oral     SpO2 02/03/16 2343 99 %     Weight 02/03/16 2343 250 lb (113.399 kg)     Height 02/03/16 2343  (  1.702 m)     Head Cir --      Peak Flow --      Pain Score 02/03/16 2343 5   Constitutional: Alert and oriented. Well appearing and in no distress. Eyes: Conjunctivae are normal. PERRL. Normal extraocular movements. ENT   Head: Normocephalic and atraumatic.   Nose: No congestion/rhinnorhea.   Mouth/Throat: Mucous membranes are moist.   Neck: No stridor. Hematological/Lymphatic/Immunilogical: No cervical lymphadenopathy. Cardiovascular: Normal rate, regular rhythm.  No murmurs, rubs, or gallops. Respiratory: Normal respiratory effort without tachypnea nor retractions. Breath sounds are clear and equal bilaterally. No wheezes/rales/rhonchi. Gastrointestinal: Soft and nontender. No distention.  Genitourinary: Deferred Musculoskeletal: Normal range of motion in all extremities. No joint effusions.   Neurologic:   Normal speech and language. No gross focal neurologic deficits are appreciated.  Skin:  Large postule noted to back of neck, no significant fluctuance.  Psychiatric: Mood and affect are normal. Speech and behavior are normal. Patient exhibits appropriate insight and judgment.  ____________________________________________    LABS (pertinent positives/negatives)  None  ____________________________________________   EKG  None  ____________________________________________    RADIOLOGY  None   ____________________________________________   PROCEDURES  Procedure(s) performed: Incision and drainage, see procedure note(s).  Critical Care performed: No  INCISION AND DRAINAGE Performed by: Phineas SemenGOODMAN, Jayvier Burgher Consent: Verbal consent obtained. Risks and benefits: risks, benefits and alternatives were discussed Type: abscess  Body area: Nape  Anesthesia: local infiltration  Incision was made with a scalpel.  Local anesthetic: lidocaine 1% without epinephrine  Anesthetic total: 1 ml  Drainage: purulent  Drainage amount: small  Patient tolerance: Patient tolerated the procedure well with no immediate complications.    ____________________________________________   INITIAL IMPRESSION / ASSESSMENT AND PLAN / ED COURSE  Pertinent labs & imaging results that were available during my care of the patient were reviewed by me and considered in my medical decision making (see chart for details).  Patient presented to the emergency department today because of concerns for boil on the back of his neck. On exam patient does have a large pustule. This was incised. Small amount of purulent material. No discharge patient home. Will give course of antibiotics. Discussed return precautions  ____________________________________________   FINAL CLINICAL IMPRESSION(S) / ED DIAGNOSES  Final diagnoses:  Abscess     Phineas SemenGraydon Toryn Mcclinton, MD 02/04/16 0145

## 2016-02-04 NOTE — Discharge Instructions (Signed)
Please seek medical attention for any high fevers, chest pain, shortness of breath, change in behavior, persistent vomiting, bloody stool or any other new or concerning symptoms. ° ° °Abscess °An abscess (boil or furuncle) is an infected area on or under the skin. This area is filled with yellowish-white fluid (pus) and other material (debris). °HOME CARE  °· Only take medicines as told by your doctor. °· If you were given antibiotic medicine, take it as directed. Finish the medicine even if you start to feel better. °· If gauze is used, follow your doctor's directions for changing the gauze. °· To avoid spreading the infection: °¨ Keep your abscess covered with a bandage. °¨ Wash your hands well. °¨ Do not share personal care items, towels, or whirlpools with others. °¨ Avoid skin contact with others. °· Keep your skin and clothes clean around the abscess. °· Keep all doctor visits as told. °GET HELP RIGHT AWAY IF:  °· You have more pain, puffiness (swelling), or redness in the wound site. °· You have more fluid or blood coming from the wound site. °· You have muscle aches, chills, or you feel sick. °· You have a fever. °MAKE SURE YOU:  °· Understand these instructions. °· Will watch your condition. °· Will get help right away if you are not doing well or get worse. °  °This information is not intended to replace advice given to you by your health care provider. Make sure you discuss any questions you have with your health care provider. °  °Document Released: 04/29/2008 Document Revised: 05/12/2012 Document Reviewed: 01/25/2012 °Elsevier Interactive Patient Education ©2016 [employer] ° °

## 2016-03-23 ENCOUNTER — Emergency Department (HOSPITAL_COMMUNITY)
Admission: EM | Admit: 2016-03-23 | Discharge: 2016-03-23 | Disposition: A | Discharge status: Home / Self Care | Payer: Self-pay | Attending: Emergency Medicine | Admitting: Emergency Medicine

## 2016-03-23 ENCOUNTER — Encounter (HOSPITAL_COMMUNITY): Payer: Self-pay

## 2016-03-23 ENCOUNTER — Emergency Department (HOSPITAL_COMMUNITY): Payer: Self-pay

## 2016-03-23 DIAGNOSIS — S8992XA Unspecified injury of left lower leg, initial encounter: Secondary | ICD-10-CM | POA: Insufficient documentation

## 2016-03-23 DIAGNOSIS — M25562 Pain in left knee: Secondary | ICD-10-CM

## 2016-03-23 DIAGNOSIS — Y9289 Other specified places as the place of occurrence of the external cause: Secondary | ICD-10-CM | POA: Insufficient documentation

## 2016-03-23 DIAGNOSIS — Y9389 Activity, other specified: Secondary | ICD-10-CM | POA: Insufficient documentation

## 2016-03-23 DIAGNOSIS — X58XXXA Exposure to other specified factors, initial encounter: Secondary | ICD-10-CM | POA: Insufficient documentation

## 2016-03-23 DIAGNOSIS — Y998 Other external cause status: Secondary | ICD-10-CM | POA: Insufficient documentation

## 2016-03-23 DIAGNOSIS — F410 Panic disorder [episodic paroxysmal anxiety] without agoraphobia: Secondary | ICD-10-CM | POA: Insufficient documentation

## 2016-03-23 DIAGNOSIS — I10 Essential (primary) hypertension: Secondary | ICD-10-CM | POA: Insufficient documentation

## 2016-03-23 DIAGNOSIS — Z9889 Other specified postprocedural states: Secondary | ICD-10-CM | POA: Insufficient documentation

## 2016-03-23 DIAGNOSIS — Z79899 Other long term (current) drug therapy: Secondary | ICD-10-CM | POA: Insufficient documentation

## 2016-03-23 MED ORDER — HYDROCODONE-ACETAMINOPHEN 5-325 MG PO TABS: 2.0000 | ORAL_TABLET | ORAL | Status: DC | PRN

## 2016-03-23 MED ORDER — HYDROCODONE-ACETAMINOPHEN 5-325 MG PO TABS
1.0000 | ORAL_TABLET | Freq: Once | ORAL | Status: AC
  Administered 2016-03-23: 1 via ORAL
  Filled 2016-03-23: qty 1

## 2016-03-23 NOTE — ED Provider Notes (Signed)
CSN: [id number]     Arrival date & time 03/23/16  1256 History   First MD Initiated Contact with Patient 03/23/16 1423     Chief Complaint  Patient presents with  . Knee Pain     (Consider location/radiation/quality/duration/timing/severity/associated sxs/prior Treatment) HPI   Marvin Case is a 30 y.o M with a pmhx of HTN who presents to the ED today c/o left knee pain. Patient states that last night he was getting up off the couch and felt his left knee pop. He states he felt like it went out of place and then went back in. He has had associated swelling and pain ever since. The pain is worse with extension and with ambulating. Patient has a history of 2 meniscal repairs in that knee a few years ago. He has tried taken OTC pain medication without relief. He denies redness or warmth around his knee. Denies paresthesias or weakness.  Past Medical History  Diagnosis Date  . Anxiety   . Panic attacks   . Hypertension    Past Surgical History  Procedure Laterality Date  . Knee surgery    . Ankle fracture surgery    . Knee surgery    . Ankle surgery     No family history on file. Social History  Substance Use Topics  . Smoking status: Never Smoker   . Smokeless tobacco: None  . Alcohol Use: No    Review of Systems  All other systems reviewed and are negative.     Allergies  Review of patient's allergies indicates no known allergies.  Home Medications   Prior to Admission medications   Medication Sig Start Date End Date Taking? Authorizing Provider  acetaminophen (TYLENOL) 500 MG tablet Take 1,000 mg by mouth daily as needed for headache.    Historical Provider, MD  HYDROcodone-acetaminophen (NORCO/VICODIN) 5-325 MG per tablet Take 1-2 tablets by mouth every 6 (six) hours as needed. Patient not taking: Reported on 09/26/2015 08/02/14   Roxy Horsemanobert Browning, PA-C  ibuprofen (ADVIL,MOTRIN) 800 MG tablet Take 1 tablet (800 mg total) by mouth every 8 (eight) hours as needed.  09/26/15   Emily FilbertJonathan E Williams, MD  LORazepam (ATIVAN) 1 MG tablet Take 1 tablet (1 mg total) by mouth 2 (two) times daily. 09/26/15 09/25/16  Emily FilbertJonathan E Williams, MD  meclizine (ANTIVERT) 25 MG tablet Take 1 tablet (25 mg total) by mouth 3 (three) times daily as needed for dizziness. 01/23/16   Myrna Blazeravid Matthew Schaevitz, MD  ondansetron (ZOFRAN) 4 MG tablet Take 1 tablet (4 mg total) by mouth every 6 (six) hours. Patient not taking: Reported on 09/26/2015 08/02/14   Roxy Horsemanobert Browning, PA-C  sulfamethoxazole-trimethoprim (BACTRIM DS,SEPTRA DS) 800-160 MG tablet Take 1 tablet by mouth 2 (two) times daily. 02/04/16   Phineas SemenGraydon Goodman, MD   BP 128/79 mmHg  Pulse 103  Temp(Src) 99.5 F (37.5 C)  Resp 18  SpO2 96% Physical Exam  Constitutional: He is oriented to person, place, and time. He appears well-developed and well-nourished. No distress.  HENT:  Head: Normocephalic and atraumatic.  Eyes: Conjunctivae are normal. Right eye exhibits no discharge. Left eye exhibits no discharge. No scleral icterus.  Cardiovascular: Normal rate and intact distal pulses.   Pulmonary/Chest: Effort normal.  Musculoskeletal:  Negative anterior/poster drawer bilaterally. Negative ballottement test. No varus or valgus laxity. No crepitus. Moderate pain felt with flexion. TTP over anterior and medial aspect of left knee.    Neurological: He is alert and oriented to person, place, and time.  Coordination normal.  Skin: Skin is warm and dry. No rash noted. He is not diaphoretic. No erythema. No pallor.  Psychiatric: He has a normal mood and affect. His behavior is normal.  Nursing note and vitals reviewed.   ED Course  Procedures (including critical care time) Labs Review Labs Reviewed - No data to display  Imaging Review Dg Knee Complete 4 Views Left  03/23/2016  CLINICAL DATA:  LEFT KNEE PAIN. Pt states he got up off the couch last night and heard/felt a "pop". Pain in the center of anterior knee, and in posterior knee.  Hx 2 surgeries on L knee for torn meniscus EXAM: LEFT KNEE - COMPLETE 4+ VIEW COMPARISON:  None. FINDINGS: There is no evidence of fracture, dislocation, or joint effusion. There is no evidence of arthropathy or other focal bone abnormality. Soft tissues are unremarkable. IMPRESSION: Negative. Electronically Signed   By: Esperanza Heir M.D.   On: 03/23/2016 13:46   I have personally reviewed and evaluated these images and lab results as part of my medical decision-making.   EKG Interpretation None      MDM   Final diagnoses:  Left knee pain    Patient X-Ray negative for obvious fracture or dislocation. Pain managed in ED. Pt advised to follow up with orthopedics if symptoms persist for possibility of missed fracture Or ligamental injury. Patient given knee immobilizer and crutches while in ED, conservative therapy recommended and discussed. Patient will be dc home & is agreeable with above plan.     Lester Kinsman [place], PA-C 03/23/16 1513  Linwood Dibbles, MD 03/23/16 2039

## 2016-03-23 NOTE — ED Notes (Signed)
Patient here with left knee pain since last pm. Reports that he got up off couch and felt knee pop out of place. No obvious trauma, no swelling

## 2016-03-23 NOTE — Discharge Instructions (Signed)
Knee Pain Knee pain is a very common symptom and can have many causes. Knee pain often goes away when you follow your health care provider's instructions for relieving pain and discomfort at home. However, knee pain can develop into a condition that needs treatment. Some conditions may include:  Arthritis caused by wear and tear (osteoarthritis).  Arthritis caused by swelling and irritation (rheumatoid arthritis or gout).  A cyst or growth in your knee.  An infection in your knee joint.  An injury that will not heal.  Damage, swelling, or irritation of the tissues that support your knee (torn ligaments or tendinitis). If your knee pain continues, additional tests may be ordered to diagnose your condition. Tests may include X-rays or other imaging studies of your knee. You may also need to have fluid removed from your knee. Treatment for ongoing knee pain depends on the cause, but treatment may include:  Medicines to relieve pain or swelling.  Steroid injections in your knee.  Physical therapy.  Surgery. HOME CARE INSTRUCTIONS  Take medicines only as directed by your health care provider.  Rest your knee and keep it raised (elevated) while you are resting.  Do not do things that cause or worsen pain.  Avoid high-impact activities or exercises, such as running, jumping rope, or doing jumping jacks.  Apply ice to the knee area:  Put ice in a plastic bag.  Place a towel between your skin and the bag.  Leave the ice on for 20 minutes, 2-3 times a day.  Ask your health care provider if you should wear an elastic knee support.  Keep a pillow under your knee when you sleep.  Lose weight if you are overweight. Extra weight can put pressure on your knee.  Do not use any tobacco products, including cigarettes, chewing tobacco, or electronic cigarettes. If you need help quitting, ask your health care provider. Smoking may slow the healing of any bone and joint problems that you may  have. SEEK MEDICAL CARE IF:  Your knee pain continues, changes, or gets worse.  You have a fever along with knee pain.  Your knee buckles or locks up.  Your knee becomes more swollen. SEEK IMMEDIATE MEDICAL CARE IF:   Your knee joint feels hot to the touch.  You have chest pain or trouble breathing.   This information is not intended to replace advice given to you by your health care provider. Make sure you discuss any questions you have with your health care provider.   Follow-up with orthopedic provider if your symptoms aren't improved. Apply ice to affected area. Keep leg elevated as much as possible. Take the medication as needed. Keep your knee and the knee immobilizer. Symptoms are still present. Return to the ED if you ask parents fevers, chills, increased redness or swelling around her knee, numbness or tingling in her left lower extremity, chest pain or shortness of breath.

## 2016-03-27 ENCOUNTER — Emergency Department: Payer: Self-pay

## 2016-03-27 ENCOUNTER — Encounter: Payer: Self-pay | Admitting: Emergency Medicine

## 2016-03-27 DIAGNOSIS — S50812A Abrasion of left forearm, initial encounter: Secondary | ICD-10-CM | POA: Insufficient documentation

## 2016-03-27 DIAGNOSIS — Y929 Unspecified place or not applicable: Secondary | ICD-10-CM | POA: Insufficient documentation

## 2016-03-27 DIAGNOSIS — Z79899 Other long term (current) drug therapy: Secondary | ICD-10-CM | POA: Insufficient documentation

## 2016-03-27 DIAGNOSIS — Z791 Long term (current) use of non-steroidal anti-inflammatories (NSAID): Secondary | ICD-10-CM | POA: Insufficient documentation

## 2016-03-27 DIAGNOSIS — I10 Essential (primary) hypertension: Secondary | ICD-10-CM | POA: Insufficient documentation

## 2016-03-27 DIAGNOSIS — W231XXA Caught, crushed, jammed, or pinched between stationary objects, initial encounter: Secondary | ICD-10-CM | POA: Insufficient documentation

## 2016-03-27 DIAGNOSIS — Y999 Unspecified external cause status: Secondary | ICD-10-CM | POA: Insufficient documentation

## 2016-03-27 DIAGNOSIS — Z23 Encounter for immunization: Secondary | ICD-10-CM | POA: Insufficient documentation

## 2016-03-27 DIAGNOSIS — Y939 Activity, unspecified: Secondary | ICD-10-CM | POA: Insufficient documentation

## 2016-03-27 NOTE — ED Notes (Signed)
C/o left wrist injury.  States crushed left srist between back hoe and metal drum this morning around 1100.  Brisk cap refill.  Pain with ROM. + radial pulse.

## 2016-03-28 ENCOUNTER — Emergency Department
Admission: EM | Admit: 2016-03-28 | Discharge: 2016-03-28 | Disposition: A | Discharge status: Home / Self Care | Payer: Self-pay | Attending: Emergency Medicine | Admitting: Emergency Medicine

## 2016-03-28 DIAGNOSIS — S6992XA Unspecified injury of left wrist, hand and finger(s), initial encounter: Secondary | ICD-10-CM

## 2016-03-28 MED ORDER — TETANUS-DIPHTH-ACELL PERTUSSIS 5-2.5-18.5 LF-MCG/0.5 IM SUSP
0.5000 mL | Freq: Once | INTRAMUSCULAR | Status: AC
  Administered 2016-03-28: 0.5 mL via INTRAMUSCULAR
  Filled 2016-03-28: qty 0.5

## 2016-03-28 NOTE — ED Notes (Signed)
Pt states at work today his L arm/wrist was caught between back hoe and a tank. Noted abrasion to L arm. Pt states wrist hurts the most. Pt states accident happened at 11am, not filing workers comp. Pt applied ice earlier, has not taken anything for pain. Radial pulses present, able to move fingers and wrist without complaint. Skin warm and dry, no distress noted at this time.

## 2016-03-28 NOTE — ED Provider Notes (Signed)
[facility] Emergency Department Provider Note   ____________________________________________  Time seen: Approximately 2:19 AM  I have reviewed the triage vital signs and the nursing notes.   HISTORY  Chief Complaint Arm Injury    HPI Marvin Case is a 30 y.o. male with a history of anxiety and panic attacks was presented to the emergency department today with left wrist pain. He said that he had his wrist caught, for several seconds, between oil drum and tobacco this a.m. at work. He says that he has had pain in the left wrist and the base of the left thumb and was concerned that he fracture. He also has an abrasion overlying the area and does not know when his last tetanus shot was. He is able to range the fingers and hand but with pain.   Past Medical History  Diagnosis Date  . Anxiety   . Panic attacks   . Hypertension     There are no active problems to display for this patient.   Past Surgical History  Procedure Laterality Date  . Knee surgery    . Ankle fracture surgery    . Knee surgery    . Ankle surgery      Current Outpatient Rx  Name  Route  Sig  Dispense  Refill  . acetaminophen (TYLENOL) 500 MG tablet   Oral   Take 1,000 mg by mouth daily as needed for headache.         Marland Kitchen HYDROcodone-acetaminophen (NORCO/VICODIN) 5-325 MG per tablet   Oral   Take 1-2 tablets by mouth every 6 (six) hours as needed. Patient not taking: Reported on 09/26/2015   10 tablet   0   . HYDROcodone-acetaminophen (NORCO/VICODIN) 5-325 MG tablet   Oral   Take 2 tablets by mouth every 4 (four) hours as needed.   6 tablet   0   . ibuprofen (ADVIL,MOTRIN) 800 MG tablet   Oral   Take 1 tablet (800 mg total) by mouth every 8 (eight) hours as needed.   30 tablet   0   . LORazepam (ATIVAN) 1 MG tablet   Oral   Take 1 tablet (1 mg total) by mouth 2 (two) times daily.   20 tablet   0   . meclizine (ANTIVERT) 25 MG tablet   Oral   Take 1  tablet (25 mg total) by mouth 3 (three) times daily as needed for dizziness.   30 tablet   0   . ondansetron (ZOFRAN) 4 MG tablet   Oral   Take 1 tablet (4 mg total) by mouth every 6 (six) hours. Patient not taking: Reported on 09/26/2015   12 tablet   0   . sulfamethoxazole-trimethoprim (BACTRIM DS,SEPTRA DS) 800-160 MG tablet   Oral   Take 1 tablet by mouth 2 (two) times daily.   10 tablet   0     Allergies Review of patient's allergies indicates no known allergies.  No family history on file.  Social History Social History  Substance Use Topics  . Smoking status: Never Smoker   . Smokeless tobacco: None  . Alcohol Use: No    Review of Systems Constitutional: No fever/chills Eyes: No visual changes. ENT: No sore throat. Cardiovascular: Denies chest pain. Respiratory: Denies shortness of breath. Gastrointestinal: No abdominal pain.  No nausea, no vomiting.  No diarrhea.  No constipation. Genitourinary: Negative for dysuria. Musculoskeletal: Negative for back pain. Skin: Negative for rash. Neurological: Negative for headaches, focal  weakness or numbness.  10-point ROS otherwise negative.  ____________________________________________   PHYSICAL EXAM:  VITAL SIGNS: ED Triage Vitals  Enc Vitals Group     BP 03/27/16 2203 138/88 mmHg     Pulse Rate 03/27/16 2203 110     Resp 03/27/16 2203 18     Temp 03/27/16 2203 98.3 F (36.8 C)     Temp Source 03/27/16 2203 Oral     SpO2 03/27/16 2203 97 %     Weight 03/27/16 2203 240 lb (108.863 kg)     Height 03/27/16 2203 5\' 7"  (1.702 m)     Head Cir --      Peak Flow --      Pain Score 03/27/16 2201 8     Pain Loc --      Pain Edu? --      Excl. in GC? --     Constitutional: Alert and oriented. Well appearing and in no acute distress. Eyes: Conjunctivae are normal. PERRL. EOMI. Head: Atraumatic. Nose: No congestion/rhinnorhea. Mouth/Throat: Mucous membranes are moist.  Neck: No stridor.   Cardiovascular:  Normal rate, regular rhythm. Grossly normal heart sounds. Respiratory: Normal respiratory effort.  No retractions. Lungs CTAB. Gastrointestinal: Soft and nontender. No distention.  Musculoskeletal: Left upper extremity with tenderness to palpation over the lateral left wrist as well as the anatomic snuffbox. There is pain to axial load of the left thumb.  There is brisk capillary refill. 5 out of 5 grip strength. No deformity or swelling. Small abrasion overlying the radial styloid without any surrounding pus, induration. Neurologic:  Normal speech and language. No gross focal neurologic deficits are appreciated. No gait instability. Skin:  Skin is warm, dry and intact. No rash noted. Psychiatric: Mood and affect are normal. Speech and behavior are normal.  ____________________________________________   LABS (all labs ordered are listed, but only abnormal results are displayed)  Labs Reviewed - No data to display ____________________________________________  EKG   ____________________________________________  RADIOLOGY   DG Wrist Complete Left (Final result) Result time: 03/27/16 22:36:57   Final result by Rad Results In Interface (03/27/16 22:36:57)   Narrative:   CLINICAL DATA: Crush injury to the left wrist this morning with subsequent pain and swelling.  EXAM: LEFT WRIST - COMPLETE 3+ VIEW  COMPARISON: Left hand 08/20/2006  FINDINGS: There is no evidence of fracture or dislocation. There is no evidence of arthropathy or other focal bone abnormality. Soft tissues are unremarkable.  IMPRESSION: Negative.   Electronically Signed By: Burman NievesWilliam Stevens M.D. On: 03/27/2016 22:36       ____________________________________________   PROCEDURES    ____________________________________________   INITIAL IMPRESSION / ASSESSMENT AND PLAN / ED COURSE  Pertinent labs & imaging results that were available during my care of the patient were reviewed by me  and considered in my medical decision making (see chart for details).  Reassuring x-ray but concern for scaphoid fracture given the location of the tenderness on exam. We'll update the patient's tetanus shot and put in a spica splint. I discussed with him his reassuring x-ray but the need for splinting and follow-up with orthopedics because of the possibility of a fracture to one of the smaller bones of the wrist. He understands this plan and is willing to comply. We also discussed elevation as well as ice to the area. ____________________________________________   FINAL CLINICAL IMPRESSION(S) / ED DIAGNOSES  Left wrist pain. Abrasion.    NEW MEDICATIONS STARTED DURING THIS VISIT:  New Prescriptions   No medications  on file     Note:  This document was prepared using Dragon voice recognition software and may include unintentional dictation errors.    Myrna Blazer, MD 03/28/16 [phone]

## 2016-03-28 NOTE — ED Notes (Signed)
Pt was comfortable with splint. Pt did not complain of any pain with application. Pulse was within normal limits. Capillary refill was normal and visible. No signs of tightness with splint present. Pt satisfied with splinting.

## 2016-03-31 ENCOUNTER — Encounter (HOSPITAL_COMMUNITY): Payer: Self-pay | Admitting: Emergency Medicine

## 2016-03-31 ENCOUNTER — Emergency Department (HOSPITAL_COMMUNITY)
Admission: EM | Admit: 2016-03-31 | Discharge: 2016-04-01 | Disposition: A | Discharge status: Home / Self Care | Payer: Self-pay | Attending: Emergency Medicine | Admitting: Emergency Medicine

## 2016-03-31 DIAGNOSIS — M25532 Pain in left wrist: Secondary | ICD-10-CM | POA: Insufficient documentation

## 2016-03-31 DIAGNOSIS — Z792 Long term (current) use of antibiotics: Secondary | ICD-10-CM | POA: Insufficient documentation

## 2016-03-31 DIAGNOSIS — I10 Essential (primary) hypertension: Secondary | ICD-10-CM | POA: Insufficient documentation

## 2016-03-31 DIAGNOSIS — F410 Panic disorder [episodic paroxysmal anxiety] without agoraphobia: Secondary | ICD-10-CM | POA: Insufficient documentation

## 2016-03-31 NOTE — ED Provider Notes (Signed)
CSN: [id number]     Arrival date & time 03/31/16  2213 History  By signing my name below, I, [facility], attest that this documentation has been prepared under the direction and in the presence of Azalia Bilis, MD. Electronically Signed: Randell Patient, ED Scribe. 04/01/2016. 12:46 PM.   Chief Complaint  Patient presents with  . Arm Pain   The history is provided by the patient. No language interpreter was used.   HPI Comments: Marvin Case is a 30 y.o. male who presents to the Emergency Department complaining of constant, moderate left wrist pain onset 4 days ago while at work. Pt states that he injured his wrist 4 days ag and was seen at [facility] where he received negative left wrist x-ray, given a splint, advised to follow-up with an orthopedist, and advised to return to the ED if hand numbness, tightness in his finger, or his fingers turn white. He reports swelling in his wrist and tightness in his left hand. Pain is worse with movement. He has worn his splint, rested, and elevated his left wrist without relief. Denies any other symptoms currently.  Past Medical History  Diagnosis Date  . Anxiety   . Panic attacks   . Hypertension    Past Surgical History  Procedure Laterality Date  . Knee surgery    . Ankle fracture surgery    . Knee surgery    . Ankle surgery     No family history on file. Social History  Substance Use Topics  . Smoking status: Never Smoker   . Smokeless tobacco: None  . Alcohol Use: No    Review of Systems A complete 10 system review of systems was obtained and all systems are negative except as noted in the HPI and PMH.   Allergies  Review of patient's allergies indicates no known allergies.  Home Medications   Prior to Admission medications   Medication Sig Start Date End Date Taking? Authorizing Provider  acetaminophen (TYLENOL) 500 MG tablet Take 1,000 mg by mouth daily as needed for headache.    Historical Provider, MD   HYDROcodone-acetaminophen (NORCO/VICODIN) 5-325 MG per tablet Take 1-2 tablets by mouth every 6 (six) hours as needed. Patient not taking: Reported on 09/26/2015 08/02/14   Roxy Horseman, PA-C  HYDROcodone-acetaminophen (NORCO/VICODIN) 5-325 MG tablet Take 2 tablets by mouth every 4 (four) hours as needed. 03/23/16   Samantha Tripp Dowless, PA-C  ibuprofen (ADVIL,MOTRIN) 800 MG tablet Take 1 tablet (800 mg total) by mouth every 8 (eight) hours as needed. 09/26/15   Emily Filbert, MD  LORazepam (ATIVAN) 1 MG tablet Take 1 tablet (1 mg total) by mouth 2 (two) times daily. 09/26/15 09/25/16  Emily Filbert, MD  meclizine (ANTIVERT) 25 MG tablet Take 1 tablet (25 mg total) by mouth 3 (three) times daily as needed for dizziness. 01/23/16   Myrna Blazer, MD  ondansetron (ZOFRAN) 4 MG tablet Take 1 tablet (4 mg total) by mouth every 6 (six) hours. Patient not taking: Reported on 09/26/2015 08/02/14   Roxy Horseman, PA-C  sulfamethoxazole-trimethoprim (BACTRIM DS,SEPTRA DS) 800-160 MG tablet Take 1 tablet by mouth 2 (two) times daily. 02/04/16   Phineas Semen, MD   BP 159/93 mmHg  Pulse 98  Temp(Src) 98.6 F (37 C) (Oral)  Resp 16  Ht  (1.702 m)  Wt 240 lb (108.863 kg)  BMI 37.58 kg/m2  SpO2 97% Physical Exam  Constitutional: He is oriented to person, place, and time. He appears  well-developed and well-nourished.  HENT:  Head: Normocephalic.  Eyes: EOM are normal.  Neck: Normal range of motion.  Pulmonary/Chest: Effort normal.  Abdominal: He exhibits no distension.  Musculoskeletal: Normal range of motion.  Mild tenderness left anatomic snuff box. Mild pain with ROM of left wrist. No obvious deformity. No erythema or warmth.  Neurological: He is alert and oriented to person, place, and time.  Psychiatric: He has a normal mood and affect.  Nursing note and vitals reviewed.   ED Course  Procedures    SPLINT APPLICATION Authorized by: Lyanne CoAMPOS,Charletta Voight M Consent: Verbal  consent obtained. Risks and benefits: risks, benefits and alternatives were discussed Consent given by: patient Splint applied by: orthopedic technician Location details: left UE Splint type:  left thumb spica Supplies used: plaster Post-procedure: The splinted body part was neurovascularly unchanged following the procedure. Patient tolerance: Patient tolerated the procedure well with no immediate complications.     DIAGNOSTIC STUDIES: Oxygen Saturation is 97% on RA, normal by my interpretation.    COORDINATION OF CARE: 11:39 PM Will order radial gutter splint. Discussed treatment plan with pt at bedside and pt agreed to plan.  12:30 AM Spoke with ortho tech who advised a thumb spica splint. Will cancel order for radial gutter splint and order thumb spica. Will discharge home.   MDM   Final diagnoses:  Left wrist pain    Patient with recent injury to left wrist with snuffbox tenderness. He is placed in a splint for precautionary standpoint with outpatient orthopedic follow-up. He came today just to request to splint the changes is causing him some pain. There is no underlying signs of infection. He'll be placed in a thumb spica and has follow-up with orthopedist this week. Overall well-appearing. Compartments soft.  I personally performed the services described in this documentation, which was scribed in my presence. The recorded information has been reviewed and is accurate.       Azalia BilisKevin Kaileia Flow, MD 04/01/16 [phone]

## 2016-03-31 NOTE — ED Notes (Signed)
Pt from home with c/o left wrist pain following an injury on 5/3. Pt was supposed to follow up with an ortho specialist, but states he was unable to due to lack of health insurance. Pt states that yesterday morning pt began experiencing swelling in the wrist. Pt is still able to move his fingers and still has adequate cap refill. Pt states he was not discharged with any rx and has not taken any medication for pain/swelling

## 2016-04-01 NOTE — Progress Notes (Signed)
Orthopedic Tech Progress Note Patient Details:  Marvin Case [birth year] [id number]  Ortho Devices Type of Ortho Device: Ulna gutter splint Ortho Device/Splint Location: lue Ortho Device/Splint Interventions: Ordered, Application   Trinna PostMartinez, Marvin Case 04/01/2016, 12:44 AM

## 2016-04-01 NOTE — ED Notes (Signed)
Ortho tech here to apply thumb spica on patient.

## 2016-05-02 ENCOUNTER — Encounter: Payer: Self-pay | Admitting: Emergency Medicine

## 2016-05-02 ENCOUNTER — Emergency Department: Payer: Self-pay

## 2016-05-02 DIAGNOSIS — Z5321 Procedure and treatment not carried out due to patient leaving prior to being seen by health care provider: Secondary | ICD-10-CM | POA: Insufficient documentation

## 2016-05-02 DIAGNOSIS — R0602 Shortness of breath: Secondary | ICD-10-CM | POA: Insufficient documentation

## 2016-05-02 LAB — CBC
HCT: 44.4 % (ref 40.0–52.0)
Hemoglobin: 15.2 g/dL (ref 13.0–18.0)
MCH: 29.8 pg (ref 26.0–34.0)
MCHC: 34.3 g/dL (ref 32.0–36.0)
MCV: 86.9 fL (ref 80.0–100.0)
PLATELETS: 227 10*3/uL (ref 150–440)
RBC: 5.11 MIL/uL (ref 4.40–5.90)
RDW: 12.7 % (ref 11.5–14.5)
WBC: 15.7 10*3/uL — AB (ref 3.8–10.6)

## 2016-05-02 LAB — COMPREHENSIVE METABOLIC PANEL
ALT: 37 U/L (ref 17–63)
AST: 31 U/L (ref 15–41)
Albumin: 4.4 g/dL (ref 3.5–5.0)
Alkaline Phosphatase: 61 U/L (ref 38–126)
Anion gap: 7 (ref 5–15)
BUN: 10 mg/dL (ref 6–20)
CHLORIDE: 104 mmol/L (ref 101–111)
CO2: 27 mmol/L (ref 22–32)
CREATININE: 0.91 mg/dL (ref 0.61–1.24)
Calcium: 9.1 mg/dL (ref 8.9–10.3)
GFR calc Af Amer: >60 mL/min (ref >60)
Glucose, Bld: 112 mg/dL — ABNORMAL HIGH (ref 65–99)
Potassium: 3.3 mmol/L — ABNORMAL LOW (ref 3.5–5.1)
Sodium: 138 mmol/L (ref 135–145)
Total Bilirubin: 0.6 mg/dL (ref 0.3–1.2)
Total Protein: 7.4 g/dL (ref 6.5–8.1)

## 2016-05-02 NOTE — ED Notes (Signed)
Pt presents to ED with cough, congestion, and soreness to his abd muscles just under his rib cage. Pt states there was a lot of dust at work today and wasn't sure if that had something to do with it.

## 2016-05-03 ENCOUNTER — Telehealth: Payer: Self-pay | Admitting: Emergency Medicine

## 2016-05-03 ENCOUNTER — Emergency Department
Admission: EM | Admit: 2016-05-03 | Discharge: 2016-05-03 | Disposition: A | Discharge status: Home / Self Care | Payer: Self-pay | Attending: Emergency Medicine | Admitting: Emergency Medicine

## 2016-05-03 NOTE — ED Notes (Signed)
Called patient due to lwot to inquire about condition and follow up plans. He says he is feeling a little better.  i told him that we have done labs and the xray and that he can return here or go to an urgent care.  I advised that he get an exam by a provider , especially with his shortness of breath.

## 2016-06-23 ENCOUNTER — Encounter: Payer: Self-pay | Admitting: Emergency Medicine

## 2016-06-23 ENCOUNTER — Emergency Department
Admission: EM | Admit: 2016-06-23 | Discharge: 2016-06-24 | Disposition: A | Discharge status: Home / Self Care | Payer: Self-pay | Attending: Student | Admitting: Student

## 2016-06-23 DIAGNOSIS — I10 Essential (primary) hypertension: Secondary | ICD-10-CM | POA: Insufficient documentation

## 2016-06-23 DIAGNOSIS — K130 Diseases of lips: Secondary | ICD-10-CM | POA: Insufficient documentation

## 2016-06-23 DIAGNOSIS — L0291 Cutaneous abscess, unspecified: Secondary | ICD-10-CM

## 2016-06-23 LAB — CBC WITH DIFFERENTIAL/PLATELET
BASOS PCT: 1 %
Basophils Absolute: 0.1 10*3/uL (ref 0–0.1)
EOS ABS: 0.1 10*3/uL (ref 0–0.7)
EOS PCT: 1 %
HCT: 42.9 % (ref 40.0–52.0)
Hemoglobin: 15.1 g/dL (ref 13.0–18.0)
LYMPHS ABS: 2.5 10*3/uL (ref 1.0–3.6)
Lymphocytes Relative: 18 %
MCH: 30.1 pg (ref 26.0–34.0)
MCHC: 35.3 g/dL (ref 32.0–36.0)
MCV: 85.4 fL (ref 80.0–100.0)
Monocytes Absolute: 0.9 10*3/uL (ref 0.2–1.0)
Monocytes Relative: 6 %
NEUTROS PCT: 74 %
Neutro Abs: 10.3 10*3/uL — ABNORMAL HIGH (ref 1.4–6.5)
PLATELETS: 227 10*3/uL (ref 150–440)
RBC: 5.02 MIL/uL (ref 4.40–5.90)
RDW: 12.5 % (ref 11.5–14.5)
WBC: 13.8 10*3/uL — AB (ref 3.8–10.6)

## 2016-06-23 NOTE — ED Provider Notes (Signed)
[facility] Emergency Department Provider Note   ____________________________________________   First MD Initiated Contact with Patient 06/23/16 2338     (approximate)  I have reviewed the triage vital signs and the nursing notes.   HISTORY  Chief Complaint Abscess    HPI Marvin Case is a 30 y.o. male with no chronic medical problems who presents for evaluation of pain after drainage of an abscess just below his lower lip, gradual onset today, constant, currently moderate to severe, not improved with Tylenol. Patient reports that yesterday he noted a small area of swelling underneath his lower lip which progressively got bigger today. He was seen by a physician earlier today and underwent incision and drainage of the area, was started on Keflex however he has not filled that prescription yet. He reports that since the anesthesia wore off he is having some pain in the area. He reports that his"whole body ache". No fevers, no vomiting, diarrhea, no chills. No chest pain or difficulty breathing. No shortness of breath.   Past Medical History:  Diagnosis Date  . Anxiety   . Hypertension   . Panic attacks     There are no active problems to display for this patient.   Past Surgical History:  Procedure Laterality Date  . ANKLE FRACTURE SURGERY    . ANKLE SURGERY    . KNEE SURGERY    . KNEE SURGERY      Prior to Admission medications   Medication Sig Start Date End Date Taking? Authorizing Provider  acetaminophen (TYLENOL) 500 MG tablet Take 1,000 mg by mouth daily as needed for headache.    Historical Provider, MD  HYDROcodone-acetaminophen (NORCO/VICODIN) 5-325 MG per tablet Take 1-2 tablets by mouth every 6 (six) hours as needed. Patient not taking: Reported on 09/26/2015 08/02/14   Roxy Horseman, PA-C  HYDROcodone-acetaminophen (NORCO/VICODIN) 5-325 MG tablet Take 2 tablets by mouth every 4 (four) hours as needed. 03/23/16   Samantha Tripp  Dowless, PA-C  ibuprofen (ADVIL,MOTRIN) 800 MG tablet Take 1 tablet (800 mg total) by mouth every 8 (eight) hours as needed. 09/26/15   Emily Filbert, MD  LORazepam (ATIVAN) 1 MG tablet Take 1 tablet (1 mg total) by mouth 2 (two) times daily. 09/26/15 09/25/16  Emily Filbert, MD  meclizine (ANTIVERT) 25 MG tablet Take 1 tablet (25 mg total) by mouth 3 (three) times daily as needed for dizziness. 01/23/16   Myrna Blazer, MD  ondansetron (ZOFRAN) 4 MG tablet Take 1 tablet (4 mg total) by mouth every 6 (six) hours. Patient not taking: Reported on 09/26/2015 08/02/14   Roxy Horseman, PA-C  sulfamethoxazole-trimethoprim (BACTRIM DS,SEPTRA DS) 800-160 MG tablet Take 1 tablet by mouth 2 (two) times daily. 02/04/16   Phineas Semen, MD    Allergies Review of patient's allergies indicates no known allergies.  History reviewed. No pertinent family history.  Social History Social History  Substance Use Topics  . Smoking status: Never Smoker  . Smokeless tobacco: Never Used  . Alcohol use No    Review of Systems Constitutional: No fever/chills Eyes: No visual changes. ENT: No sore throat. Cardiovascular: Denies chest pain. Respiratory: Denies shortness of breath. Gastrointestinal: No abdominal pain.  No nausea, no vomiting.  No diarrhea.  No constipation. Genitourinary: Negative for dysuria. Musculoskeletal: Negative for back pain. Skin: Negative for rash. Neurological: Negative for headaches, focal weakness or numbness.  10-point ROS otherwise negative.  ____________________________________________   PHYSICAL EXAM:  VITAL SIGNS: ED Triage Vitals  Enc Vitals Group     BP 06/23/16 2106 (!) 146/88     Pulse Rate 06/23/16 2106 97     Resp 06/23/16 2106 18     Temp 06/23/16 2106 99 F (37.2 C)     Temp src --      SpO2 06/23/16 2106 99 %     Weight 06/23/16 2106 240 lb (108.9 kg)     Height 06/23/16 2106 5\' 7"  (1.702 m)     Head Circumference --      Peak Flow  --      Pain Score 06/23/16 2105 8     Pain Loc --      Pain Edu? --      Excl. in GC? --     Constitutional: Alert and oriented. Well appearing and in no acute distress. Eyes: Conjunctivae are normal. PERRL. EOMI. Head: Atraumatic. Nose: No congestion/rhinnorhea. Mouth/Throat: Mucous membranes are moist.  Oropharynx non-erythematous, nonedematous, no tonsillar deviation. Very mild swelling of the lower lip, there is a very small, less than 0.5 cm area of tender induration just at the midline underneath the lower lip with an overlying healing tiny incision, no associated erythema or warmth. Neck: No stridor. Supple without meningismus. Cardiovascular: Normal rate, regular rhythm. Grossly normal heart sounds.  Good peripheral circulation. Respiratory: Normal respiratory effort.  No retractions. Lungs CTAB. Gastrointestinal: Soft and nontender. No distention.  No CVA tenderness. Genitourinary: deferred Musculoskeletal: No lower extremity tenderness nor edema.  No joint effusions. Neurologic:  Normal speech and language. No gross focal neurologic deficits are appreciated. No gait instability. Skin:  Skin is warm, dry and intact. No rash noted. Psychiatric: Mood and affect are normal. Speech and behavior are normal.  ____________________________________________   LABS (all labs ordered are listed, but only abnormal results are displayed)  Labs Reviewed  CBC WITH DIFFERENTIAL/PLATELET - Abnormal; Notable for the following:       Result Value   WBC 13.8 (*)    Neutro Abs 10.3 (*)    All other components within normal limits   ____________________________________________  EKG  none ____________________________________________  RADIOLOGY  none ____________________________________________   PROCEDURES  Procedure(s) performed: None  Procedures  Critical Care performed: No  ____________________________________________   INITIAL IMPRESSION / ASSESSMENT AND PLAN / ED  COURSE  Pertinent labs & imaging results that were available during my care of the patient were reviewed by me and considered in my medical decision making (see chart for details).  Marvin Case is a 30 y.o. male with no chronic medical problems who presents for evaluation of pain after drainage of an abscess just below his lower lip. On exam, he is very well-appearing and in no acute distress. Vital signs stable, he is afebrile. He is not meeting criteria for sepsis. He has a tiny area underneath the lower lip consistent with resolving abscess without associated cellulitis. CBC was drawn in triage and shows leukocytosis however this appears chronic on chart review and is decreased from what it was one month ago. Discussed with the patient that he is to take his Keflex as prescribed, we'll discharge with Motrin as well as a very short course of oxycodone for breakthrough pain. We discussed meticulous return precautions, need for close PCP follow-up and he is comfortable with the discharge plan. DC home.  Clinical Course     ____________________________________________   FINAL CLINICAL IMPRESSION(S) / ED DIAGNOSES  Final diagnoses:  Abscess      NEW MEDICATIONS STARTED DURING THIS VISIT:  New Prescriptions   No medications on file     Note:  This document was prepared using Dragon voice recognition software and may include unintentional dictation errors.    Gayla Doss, MD 06/24/16 [phone]

## 2016-06-23 NOTE — ED Triage Notes (Signed)
Saw his dr today and had abscess on his mouth drained and given iv antibiotics and rx for keflex which he hasn't filled. Now has bodyaches with the abscess pain.

## 2016-06-24 MED ORDER — IBUPROFEN 600 MG PO TABS
ORAL_TABLET | ORAL | Status: AC
  Filled 2016-06-24: qty 1

## 2016-06-24 MED ORDER — IBUPROFEN 600 MG PO TABS
600.0000 mg | ORAL_TABLET | Freq: Once | ORAL | Status: AC
  Administered 2016-06-24: 600 mg via ORAL
  Filled 2016-06-24: qty 1

## 2016-06-24 MED ORDER — CEPHALEXIN 500 MG PO CAPS
500.0000 mg | ORAL_CAPSULE | Freq: Once | ORAL | Status: AC
  Administered 2016-06-24: 500 mg via ORAL
  Filled 2016-06-24: qty 1

## 2016-06-24 MED ORDER — OXYCODONE HCL 5 MG PO TABS: 5.0000 mg | ORAL_TABLET | Freq: Four times a day (QID) | ORAL | 0 refills | Status: DC | PRN

## 2016-06-24 NOTE — ED Notes (Signed)
Discharge instructions reviewed with patient. Patient verbalized understanding. Patient ambulated to lobby without difficulty.   

## 2016-07-26 ENCOUNTER — Encounter: Payer: Self-pay | Admitting: Emergency Medicine

## 2016-07-26 ENCOUNTER — Emergency Department
Admission: EM | Admit: 2016-07-26 | Discharge: 2016-07-26 | Disposition: A | Discharge status: Home / Self Care | Payer: Self-pay | Attending: Emergency Medicine | Admitting: Emergency Medicine

## 2016-07-26 ENCOUNTER — Emergency Department: Payer: Self-pay

## 2016-07-26 DIAGNOSIS — L0291 Cutaneous abscess, unspecified: Secondary | ICD-10-CM

## 2016-07-26 DIAGNOSIS — Z791 Long term (current) use of non-steroidal anti-inflammatories (NSAID): Secondary | ICD-10-CM | POA: Insufficient documentation

## 2016-07-26 DIAGNOSIS — L03113 Cellulitis of right upper limb: Secondary | ICD-10-CM | POA: Insufficient documentation

## 2016-07-26 DIAGNOSIS — I10 Essential (primary) hypertension: Secondary | ICD-10-CM | POA: Insufficient documentation

## 2016-07-26 DIAGNOSIS — L02413 Cutaneous abscess of right upper limb: Secondary | ICD-10-CM | POA: Insufficient documentation

## 2016-07-26 LAB — COMPREHENSIVE METABOLIC PANEL
ALK PHOS: 51 U/L (ref 38–126)
ALT: 21 U/L (ref 17–63)
AST: 23 U/L (ref 15–41)
Albumin: 4.2 g/dL (ref 3.5–5.0)
Anion gap: 7 (ref 5–15)
BUN: 14 mg/dL (ref 6–20)
CALCIUM: 9.1 mg/dL (ref 8.9–10.3)
CO2: 27 mmol/L (ref 22–32)
CREATININE: 0.76 mg/dL (ref 0.61–1.24)
Chloride: 104 mmol/L (ref 101–111)
GFR calc non Af Amer: >60 mL/min (ref >60)
GLUCOSE: 112 mg/dL — AB (ref 65–99)
Potassium: 3.3 mmol/L — ABNORMAL LOW (ref 3.5–5.1)
SODIUM: 138 mmol/L (ref 135–145)
Total Bilirubin: 0.8 mg/dL (ref 0.3–1.2)
Total Protein: 7.2 g/dL (ref 6.5–8.1)

## 2016-07-26 LAB — CBC WITH DIFFERENTIAL/PLATELET
Basophils Absolute: 0.1 10*3/uL (ref 0–0.1)
Basophils Relative: 1 %
EOS ABS: 0.1 10*3/uL (ref 0–0.7)
Eosinophils Relative: 1 %
HCT: 42.3 % (ref 40.0–52.0)
HEMOGLOBIN: 14.7 g/dL (ref 13.0–18.0)
LYMPHS ABS: 1.7 10*3/uL (ref 1.0–3.6)
LYMPHS PCT: 14 %
MCH: 30.2 pg (ref 26.0–34.0)
MCHC: 34.8 g/dL (ref 32.0–36.0)
MCV: 86.8 fL (ref 80.0–100.0)
Monocytes Absolute: 0.6 10*3/uL (ref 0.2–1.0)
Monocytes Relative: 5 %
NEUTROS ABS: 9.4 10*3/uL — AB (ref 1.4–6.5)
NEUTROS PCT: 79 %
Platelets: 241 10*3/uL (ref 150–440)
RBC: 4.87 MIL/uL (ref 4.40–5.90)
RDW: 12.8 % (ref 11.5–14.5)
WBC: 11.9 10*3/uL — AB (ref 3.8–10.6)

## 2016-07-26 MED ORDER — SULFAMETHOXAZOLE-TRIMETHOPRIM 800-160 MG PO TABS: 1.0000 | ORAL_TABLET | Freq: Two times a day (BID) | ORAL | 0 refills | Status: DC

## 2016-07-26 MED ORDER — TRAMADOL HCL 50 MG PO TABS: 50.0000 mg | ORAL_TABLET | Freq: Four times a day (QID) | ORAL | 0 refills | Status: DC | PRN

## 2016-07-26 MED ORDER — TRAMADOL HCL 50 MG PO TABS
50.0000 mg | ORAL_TABLET | Freq: Once | ORAL | Status: AC
  Administered 2016-07-26: 50 mg via ORAL

## 2016-07-26 MED ORDER — LIDOCAINE-EPINEPHRINE (PF) 1 %-1:200000 IJ SOLN
INTRAMUSCULAR | Status: AC
  Administered 2016-07-26: 30 mL
  Filled 2016-07-26: qty 30

## 2016-07-26 MED ORDER — CEPHALEXIN 500 MG PO CAPS: 500.0000 mg | ORAL_CAPSULE | Freq: Two times a day (BID) | ORAL | 0 refills | Status: DC

## 2016-07-26 MED ORDER — TRAMADOL HCL 50 MG PO TABS
ORAL_TABLET | ORAL | Status: AC
  Filled 2016-07-26: qty 1

## 2016-07-26 MED ORDER — VANCOMYCIN HCL IN DEXTROSE 1-5 GM/200ML-% IV SOLN
1000.0000 mg | Freq: Once | INTRAVENOUS | Status: AC
  Administered 2016-07-26: 1000 mg via INTRAVENOUS
  Filled 2016-07-26: qty 200

## 2016-07-26 NOTE — ED Notes (Signed)
MD at bedside. Abscess drained and packed by Dr. Cyril LoosenKinner.

## 2016-07-26 NOTE — ED Triage Notes (Signed)
Pt states abscess to right posterior forearm for "days" that has worsened. Today. Pt denies known fever. Pt with redness and swelling noted to posterior right forearm with approx 4cm indurated red area noted to upper posterior forearm.

## 2016-07-26 NOTE — ED Provider Notes (Signed)
[facility] Emergency Department Provider Note   ____________________________________________    I have reviewed the triage vital signs and the nursing notes.   HISTORY  Chief Complaint Abscess     HPI Marvin Case is a 30 y.o. male who complains of swelling and pain to his right posterior forearm, proximal. He reports it is worsened over the last 3 days. It is painful to put pressure on the area. He denies fevers or chills. Does not inject drugs. No history of the same. No nausea or vomiting. He reports it is limiting his ability to work   Past Medical History:  Diagnosis Date  . Anxiety   . Hypertension   . Panic attacks     There are no active problems to display for this patient.   Past Surgical History:  Procedure Laterality Date  . ANKLE FRACTURE SURGERY    . ANKLE SURGERY    . KNEE SURGERY    . KNEE SURGERY      Prior to Admission medications   Medication Sig Start Date End Date Taking? Authorizing Provider  acetaminophen (TYLENOL) 500 MG tablet Take 1,000 mg by mouth daily as needed for headache.    Historical Provider, MD  ibuprofen (ADVIL,MOTRIN) 800 MG tablet Take 1 tablet (800 mg total) by mouth every 8 (eight) hours as needed. 09/26/15   Emily Filbert, MD  LORazepam (ATIVAN) 1 MG tablet Take 1 tablet (1 mg total) by mouth 2 (two) times daily. 09/26/15 09/25/16  Emily Filbert, MD  meclizine (ANTIVERT) 25 MG tablet Take 1 tablet (25 mg total) by mouth 3 (three) times daily as needed for dizziness. 01/23/16   Myrna Blazer, MD  ondansetron (ZOFRAN) 4 MG tablet Take 1 tablet (4 mg total) by mouth every 6 (six) hours. Patient not taking: Reported on 09/26/2015 08/02/14   Roxy Horseman, PA-C  oxyCODONE (ROXICODONE) 5 MG immediate release tablet Take 1 tablet (5 mg total) by mouth every 6 (six) hours as needed for moderate pain. Do not drive while taking this medication. 06/24/16   Gayla Doss, MD    sulfamethoxazole-trimethoprim (BACTRIM DS,SEPTRA DS) 800-160 MG tablet Take 1 tablet by mouth 2 (two) times daily. 02/04/16   Phineas Semen, MD     Allergies Review of patient's allergies indicates no known allergies.  No family history on file.  Social History Social History  Substance Use Topics  . Smoking status: Never Smoker  . Smokeless tobacco: Never Used  . Alcohol use No    Review of Systems  Constitutional: No fever/chills  Gastrointestinal:   No nausea, no vomiting.    Musculoskeletal: Right arm pain Skin: Redness   10-point ROS otherwise negative.  ____________________________________________   PHYSICAL EXAM:  VITAL SIGNS: ED Triage Vitals  Enc Vitals Group     BP 07/26/16 2115 (!) 154/94     Pulse Rate 07/26/16 2115 98     Resp 07/26/16 2115 18     Temp 07/26/16 2115 98.9 F (37.2 C)     Temp Source 07/26/16 2115 Oral     SpO2 07/26/16 2115 100 %     Weight 07/26/16 2116 230 lb (104.3 kg)     Height 07/26/16 2116 5\' 8"  (1.727 m)     Head Circumference --      Peak Flow --      Pain Score 07/26/16 2116 8     Pain Loc --      Pain Edu? --  Excl. in GC? --     Constitutional: Alert and oriented. No acute distress. Pleasant and interactive   Cardiovascular: Normal rate, regular rhythm. Grossly normal heart sounds.  Good peripheral circulation. Respiratory: Normal respiratory effort.  No retractions.  Genitourinary: deferred Musculoskeletal: Swelling and tenderness to the right posterior proximal forearm with area of fluctuance, abscess approximately 2 x 3 cm coming to head. Neurologic:  Normal speech and language. No gross focal neurologic deficits are appreciated.  Skin:  Skin is warm, dry and intact. Erythema extending away from abscess around the arm and past the elbow Psychiatric: Mood and affect are normal. Speech and behavior are normal.  ____________________________________________   LABS (all labs ordered are listed, but only  abnormal results are displayed)  Labs Reviewed  CBC WITH DIFFERENTIAL/PLATELET - Abnormal; Notable for the following:       Result Value   WBC 11.9 (*)    Neutro Abs 9.4 (*)    All other components within normal limits  COMPREHENSIVE METABOLIC PANEL - Abnormal; Notable for the following:    Potassium 3.3 (*)    Glucose, Bld 112 (*)    All other components within normal limits   ____________________________________________  EKG  None ____________________________________________  RADIOLOGY  X-ray unremarkable ____________________________________________   PROCEDURES  Procedure(s) performed: yes INCISION AND DRAINAGE Performed by: Jene EveryKINNER, Ayshia Gramlich Consent: Verbal consent obtained. Risks and benefits: risks, benefits and alternatives were discussed Type: abscess  Body area: right arm  Anesthesia: local infiltration  Incision was made with a scalpel.  Local anesthetic: lidocaine 1% w epinephrine  Anesthetic total: 3 ml  Complexity: complex Blunt dissection to break up loculations  Drainage: purulent  Drainage amount: small  Packing material: 1/4 in iodoform gauze  Patient tolerance: Patient tolerated the procedure well with no immediate complications.       Critical Care performed: No ____________________________________________   INITIAL IMPRESSION / ASSESSMENT AND PLAN / ED COURSE  Pertinent labs & imaging results that were available during my care of the patient were reviewed by me and considered in my medical decision making (see chart for details).  Patient with abscess with evidence of surrounding cellulitis. Patient is afebrile but his heart rate is elevated. We will check labs and give a dose of IV antibiotics. I'll discharge the patient with by mouth Keflex and Bactrim and have him return in 48 hours for wound recheck. He knows to return sooner if he feels that his infection is getting worse.  Clinical Course    ____________________________________________   FINAL CLINICAL IMPRESSION(S) / ED DIAGNOSES  Final diagnoses:  Cellulitis of right upper extremity  Abscess      NEW MEDICATIONS STARTED DURING THIS VISIT:  New Prescriptions   No medications on file     Note:  This document was prepared using Dragon voice recognition software and may include unintentional dictation errors.    Jene Everyobert Shea Swalley, MD 07/26/16 [phone]

## 2016-07-27 ENCOUNTER — Observation Stay
Admission: EM | Admit: 2016-07-27 | Discharge: 2016-07-29 | Disposition: A | Discharge status: Home / Self Care | Payer: Self-pay | Attending: Internal Medicine | Admitting: Internal Medicine

## 2016-07-27 DIAGNOSIS — L0390 Cellulitis, unspecified: Secondary | ICD-10-CM

## 2016-07-27 DIAGNOSIS — L0291 Cutaneous abscess, unspecified: Secondary | ICD-10-CM

## 2016-07-27 DIAGNOSIS — E876 Hypokalemia: Secondary | ICD-10-CM | POA: Insufficient documentation

## 2016-07-27 DIAGNOSIS — L02413 Cutaneous abscess of right upper limb: Secondary | ICD-10-CM | POA: Insufficient documentation

## 2016-07-27 DIAGNOSIS — Z79899 Other long term (current) drug therapy: Secondary | ICD-10-CM | POA: Insufficient documentation

## 2016-07-27 DIAGNOSIS — Z9889 Other specified postprocedural states: Secondary | ICD-10-CM | POA: Insufficient documentation

## 2016-07-27 DIAGNOSIS — L03113 Cellulitis of right upper limb: Principal | ICD-10-CM | POA: Insufficient documentation

## 2016-07-27 DIAGNOSIS — I10 Essential (primary) hypertension: Secondary | ICD-10-CM | POA: Insufficient documentation

## 2016-07-27 DIAGNOSIS — F419 Anxiety disorder, unspecified: Secondary | ICD-10-CM | POA: Insufficient documentation

## 2016-07-27 LAB — CBC WITH DIFFERENTIAL/PLATELET
BASOS ABS: 0.1 10*3/uL (ref 0–0.1)
BASOS PCT: 1 %
Eosinophils Absolute: 0.1 10*3/uL (ref 0–0.7)
Eosinophils Relative: 1 %
HEMATOCRIT: 41.9 % (ref 40.0–52.0)
HEMOGLOBIN: 14.8 g/dL (ref 13.0–18.0)
LYMPHS PCT: 17 %
Lymphs Abs: 1.8 10*3/uL (ref 1.0–3.6)
MCH: 30.5 pg (ref 26.0–34.0)
MCHC: 35.2 g/dL (ref 32.0–36.0)
MCV: 86.5 fL (ref 80.0–100.0)
MONO ABS: 0.9 10*3/uL (ref 0.2–1.0)
Monocytes Relative: 9 %
NEUTROS ABS: 7.9 10*3/uL — AB (ref 1.4–6.5)
NEUTROS PCT: 72 %
Platelets: 247 10*3/uL (ref 150–440)
RBC: 4.84 MIL/uL (ref 4.40–5.90)
RDW: 12.7 % (ref 11.5–14.5)
WBC: 10.7 10*3/uL — ABNORMAL HIGH (ref 3.8–10.6)

## 2016-07-27 LAB — COMPREHENSIVE METABOLIC PANEL
ALBUMIN: 4 g/dL (ref 3.5–5.0)
ALK PHOS: 50 U/L (ref 38–126)
ALT: 20 U/L (ref 17–63)
AST: 22 U/L (ref 15–41)
Anion gap: 3 — ABNORMAL LOW (ref 5–15)
BILIRUBIN TOTAL: 0.6 mg/dL (ref 0.3–1.2)
BUN: 15 mg/dL (ref 6–20)
CALCIUM: 8.9 mg/dL (ref 8.9–10.3)
CO2: 29 mmol/L (ref 22–32)
CREATININE: 0.79 mg/dL (ref 0.61–1.24)
Chloride: 105 mmol/L (ref 101–111)
GFR calc Af Amer: >60 mL/min (ref >60)
GLUCOSE: 96 mg/dL (ref 65–99)
Potassium: 4.1 mmol/L (ref 3.5–5.1)
Sodium: 137 mmol/L (ref 135–145)
TOTAL PROTEIN: 7.2 g/dL (ref 6.5–8.1)

## 2016-07-27 MED ORDER — PIPERACILLIN-TAZOBACTAM 3.375 G IVPB
3.3750 g | Freq: Once | INTRAVENOUS | Status: AC
  Administered 2016-07-27: 3.375 g via INTRAVENOUS
  Filled 2016-07-27: qty 50

## 2016-07-27 MED ORDER — MUPIROCIN 2 % EX OINT: TOPICAL_OINTMENT | CUTANEOUS | 0 refills | Status: DC

## 2016-07-27 MED ORDER — VANCOMYCIN HCL IN DEXTROSE 1-5 GM/200ML-% IV SOLN
1000.0000 mg | Freq: Once | INTRAVENOUS | Status: AC
  Administered 2016-07-27: 1000 mg via INTRAVENOUS
  Filled 2016-07-27: qty 200

## 2016-07-27 NOTE — ED Provider Notes (Addendum)
[facility] Emergency Department Provider Note        Time seen: ----------------------------------------- 8:57 PM on 07/27/2016 -----------------------------------------    I have reviewed the triage vital signs and the nursing notes.   HISTORY  Chief Complaint Abscess and Recurrent Skin Infections (See triage notes)    HPI Marvin Case is a 30 y.o. male who presents to ER for recheck of a skin abscess. Patient underwent incision and drainage of an abscess around his right elbow last night and was placed on IV antibiotics and discharged on Bactrim and Keflex. Patient states the redness has expanded up his arm towards his axilla. Patient denies fevers or chills, was told if his symptoms worsened he would need to be admitted.   Past Medical History:  Diagnosis Date  . Anxiety   . Hypertension   . Panic attacks     There are no active problems to display for this patient.   Past Surgical History:  Procedure Laterality Date  . ANKLE FRACTURE SURGERY    . ANKLE SURGERY    . KNEE SURGERY    . KNEE SURGERY      Allergies Review of patient's allergies indicates no known allergies.  Social History Social History  Substance Use Topics  . Smoking status: Never Smoker  . Smokeless tobacco: Never Used  . Alcohol use No    Review of Systems Constitutional: Negative for fever. Cardiovascular: Negative for chest pain. Respiratory: Negative for shortness of breath. Gastrointestinal: Negative for abdominal pain, vomiting and diarrhea. Genitourinary: Negative for dysuria. Musculoskeletal: Positive for right arm pain Skin: Positive for redness, abscess Neurological: Negative for headaches, focal weakness or numbness.  10-point ROS otherwise negative.  ____________________________________________   PHYSICAL EXAM:  VITAL SIGNS: ED Triage Vitals  Enc Vitals Group     BP 07/27/16 2044 128/82     Pulse Rate 07/27/16 2044 (!) 108     Resp  07/27/16 2044 20     Temp 07/27/16 2044 98.7 F (37.1 C)     Temp Source 07/27/16 2044 Oral     SpO2 --      Weight 07/27/16 2045 230 lb (104.3 kg)     Height 07/27/16 2045 5\' 8"  (1.727 m)     Head Circumference --      Peak Flow --      Pain Score 07/27/16 2045 5     Pain Loc --      Pain Edu? --      Excl. in GC? --     Constitutional: Alert and oriented. Well appearing and in no distress. Eyes: Conjunctivae are normal. Normal extraocular movements. Cardiovascular: Rapid rate, regular rhythm. No murmurs, rubs, or gallops. Respiratory: Normal respiratory effort without tachypnea nor retractions. Breath sounds are clear and equal bilaterally. No wheezes/rales/rhonchi. Gastrointestinal: Soft and nontender. Normal bowel sounds Musculoskeletal: Nontender with normal range of motion in all extremities. No lower extremity tenderness nor edema. Neurologic:  Normal speech and language. No gross focal neurologic deficits are appreciated.  Skin:  Skin erythema that extends from the right forearm to the right upper arm, approaching the axilla. There is an abscess status post incision and drainage on the right forearm dorsally with no significant drainage. Psychiatric: Mood and affect are normal. Speech and behavior are normal.  ____________________________________________  ED COURSE:  Pertinent labs & imaging results that were available during my care of the patient were reviewed by me and considered in my medical decision making (see chart for details). Clinical  Course  Patient is in no distress, his wound does appear to be worsening despite proper antibiotic treatment. We will recheck basic labs, give additional IV antibiotics and reevaluate.  Procedures ____________________________________________   LABS (pertinent positives/negatives)  Labs Reviewed  CBC WITH DIFFERENTIAL/PLATELET - Abnormal; Notable for the following:       Result Value   WBC 10.7 (*)    Neutro Abs 7.9 (*)    All  other components within normal limits  COMPREHENSIVE METABOLIC PANEL - Abnormal; Notable for the following:    Anion gap 3 (*)    All other components within normal limits  ____________________________________________  FINAL ASSESSMENT AND PLAN  Cellulitis  Plan: Patient with labs as dictated above. Patient labs are improving despite mild worsening of his skin appearance. He has received additional vancomycin and Zosyn here IV. Family would prefer that he be admitted for observation. I will discuss with the hospitalist.   Emily Filbert, MD   Note: This dictation was prepared with Dragon dictation. Any transcriptional errors that result from this process are unintentional    Emily Filbert, MD 07/27/16 1610    Emily Filbert, MD 07/27/16 [phone]

## 2016-07-27 NOTE — ED Triage Notes (Signed)
Patient seen in ER last night and treated by Dr. Cyril LoosenKinner for I&D of an abscess. Was placed on IV antibiotics, keflex and bactrim. Today the redness has spread and he was told to come back for possible admission. PA out to triage to check the wound and states it is probably going to need admission for continued IV antibiotics.

## 2016-07-27 NOTE — ED Notes (Signed)
This RN to bedside, pt's wife asked if the MD was going to come back to bed to discuss whether or not patient needed to be admitted. Pt's SO states "[facility] and "we were told to come back if it got worse, it's gotten worse, and I don't understand how 1 bag of abx is going to fix his arm".

## 2016-07-27 NOTE — H&P (Signed)
[facility] -  @ [facility], In Admission History and Physical [employer], D.O.  ---------------------------------------------------------------------------------------------------------------------   PATIENT NAME: Marvin Case MR#: [id number] DATE OF BIRTH: 1986-07-14 DATE OF ADMISSION: 07/27/2016 PRIMARY CARE PHYSICIAN: No PCP Per Patient  REQUESTING/REFERRING PHYSICIAN: ED Dr. Mayford Knife  CHIEF COMPLAINT: Chief Complaint  Patient presents with  . Abscess  . Recurrent Skin Infections    See triage notes    HISTORY OF PRESENT ILLNESS: Marvin Case is a 30 y.o. male with a known history of Hypertension and anxiety that into the emergency department tonight for worsening of right arm skin infection. Patient was seen in the emergency department last night and underwent an I&D of an abscess just distal to the right elbow. He received 1 dose of IV antibiotics and was discharged on Bactrim and Keflex. He states that overnight the redness pain and swelling have extended up his arm towards his shoulder. His discharge and suctioned yesterday indicated that if he was getting worse he needed to return to the emergency department which prompted his visit tonight. He denies any fevers or chills.  Otherwise there has been no change in status. Patient has been taking medication as prescribed and there has been no recent change in medication or diet.  There has been no recent illness, travel or sick contacts.    Patient denies fevers/chills, weakness, dizziness, chest pain, shortness of breath, N/V/C/D, abdominal pain, dysuria/frequency, changes in mental status.   EMS/ED COURSE:   Patient received Zosyn and vancomycin.  PAST MEDICAL HISTORY: Past Medical History:  Diagnosis Date  . Anxiety   . Hypertension   . Panic attacks       PAST SURGICAL HISTORY: Past Surgical History:  Procedure Laterality Date  . ANKLE FRACTURE SURGERY    . ANKLE SURGERY    . KNEE SURGERY    . KNEE  SURGERY        SOCIAL HISTORY: Social History  Substance Use Topics  . Smoking status: Never Smoker  . Smokeless tobacco: Never Used  . Alcohol use No  Patient denies any illicit drug use. He works in [employer].    FAMILY HISTORY: No family history on file.   MEDICATIONS AT HOME: Prior to Admission medications   Medication Sig Start Date End Date Taking? Authorizing Provider  acetaminophen (TYLENOL) 500 MG tablet Take 1,000 mg by mouth daily as needed for headache.   Yes Historical Provider, MD  cephALEXin (KEFLEX) 500 MG capsule Take 1 capsule (500 mg total) by mouth 2 (two) times daily. 07/26/16  Yes Jene Every, MD  sulfamethoxazole-trimethoprim (BACTRIM DS,SEPTRA DS) 800-160 MG tablet Take 1 tablet by mouth 2 (two) times daily. 07/26/16  Yes Jene Every, MD  traMADol (ULTRAM) 50 MG tablet Take 1 tablet (50 mg total) by mouth every 6 (six) hours as needed. 07/26/16 07/26/17 Yes Jene Every, MD  mupirocin ointment Idelle Jo) 2 % Apply to affected area 3 times daily 07/27/16 07/27/17  Emily Filbert, MD      DRUG ALLERGIES: No Known Allergies   REVIEW OF SYSTEMS: CONSTITUTIONAL: No fever/chills, fatigue, weakness, weight gain/loss, headache EYES: No blurry or double vision. ENT: No tinnitus, postnasal drip, redness or soreness of the oropharynx. RESPIRATORY: No cough, wheeze, hemoptysis, dyspnea. CARDIOVASCULAR: No chest pain, orthopnea, palpitations, syncope. GASTROINTESTINAL: No nausea, vomiting, constipation, diarrhea, abdominal pain, hematemesis, melena or hematochezia. GENITOURINARY: No dysuria or hematuria. ENDOCRINE: No polyuria or nocturia. No heat or cold intolerance. HEMATOLOGY: No anemia, bruising, bleeding. INTEGUMENTARY: No rashes, ulcers, lesions.Positive open wound to  the right elbow MUSCULOSKELETAL: No arthritis, swelling, gout. NEUROLOGIC: No numbness, tingling, weakness or ataxia. No seizure-type activity. PSYCHIATRIC: No anxiety, depression,  insomnia.  PHYSICAL EXAMINATION: VITAL SIGNS: Blood pressure 111/75, pulse 90, temperature 98.7 F (37.1 C), temperature source Oral, resp. rate 18, height 5\' 8"  (1.727 m), weight 104.3 kg (230 lb), SpO2 96 %.  GENERAL: 30 y.o.-year-old white male patient, well-developed, well-nourished lying in the bed in no acute distress.  Pleasant and cooperative.   HEENT: Head atraumatic, normocephalic. Pupils equal, round, reactive to light and accommodation. No scleral icterus. Extraocular muscles intact. Nares are patent. Oropharynx is clear. Mucus membranes moist. NECK: Supple, full range of motion. No JVD, no bruit heard. No thyroid enlargement, no tenderness, no cervical lymphadenopathy. CHEST: Normal breath sounds bilaterally. No wheezing, rales, rhonchi or crackles. No use of accessory muscles of respiration.  No reproducible chest wall tenderness.  CARDIOVASCULAR: S1, S2 normal. No murmurs, rubs, or gallops. Cap refill <2 seconds. ABDOMEN: Soft, nontender, nondistended. No rebound, guarding, rigidity. Normoactive bowel sounds present in all four quadrants. No organomegaly or mass. EXTREMITIES: Full range of motion. No pedal edema, cyanosis, or clubbing. NEUROLOGIC: Cranial nerves II through XII are grossly intact with no focal sensorimotor deficit. Muscle strength 5/5 in all extremities. Sensation intact. Gait not checked. PSYCHIATRIC: The patient is alert and oriented x 3. Normal affect, mood, thought content. SKIN: Warm, dry, and intact without obvious rash, lesion, or ulcer with the exception of an open wound just distal to the right elbow posteriorly with surrounding erythema that extends to the mid forearm in the mid arm. There is no drainage or pus. He has no area of induration.  LABORATORY PANEL:  CBC  Recent Labs Lab 07/27/16 2114  WBC 10.7*  HGB 14.8  HCT 41.9  PLT 247    ----------------------------------------------------------------------------------------------------------------- Chemistries  Recent Labs Lab 07/27/16 2114  NA 137  K 4.1  CL 105  CO2 29  GLUCOSE 96  BUN 15  CREATININE 0.79  CALCIUM 8.9  AST 22  ALT 20  ALKPHOS 50  BILITOT 0.6   ------------------------------------------------------------------------------------------------------------------ Cardiac Enzymes No results for input(s): TROPONINI in the last 168 hours. ------------------------------------------------------------------------------------------------------------------  RADIOLOGY: Dg Forearm Right  Result Date: 07/26/2016 CLINICAL DATA:  Acute onset of posterior right forearm erythema and swelling. Initial encounter. EXAM: RIGHT FOREARM - 2 VIEW COMPARISON:  None. FINDINGS: There is no evidence of fracture or dislocation. No osseous erosions are seen. Dorsal and ulnar soft tissue swelling is noted about the forearm. The carpal rows appear grossly intact, demonstrate normal alignment. The elbow joint is unremarkable in appearance. No elbow joint effusion is seen. IMPRESSION: No evidence of fracture or dislocation. No osseous erosions seen. Dorsal and ulnar soft tissue swelling about the forearm. Electronically Signed   By: Roanna RaiderJeffery  Chang M.D.   On: 07/26/2016 22:13    IMPRESSION AND PLAN:  This is a 30 y.o. male with a history of hypertension and anxiety now being admitted with: 1. Cellulitis/abscess of the right forearm, failed outpatient therapy. -We will continue IV vancomycin and Zosyn -Check wound cultures -Check nasal swab for MRSA -IV fluids -Pain control  Diet/Nutrition: Heart healthy Fluids: IV normal saline DVT Px: SCDs and early ambulation Code Status: Full  All the records are reviewed and case discussed with ED provider. Management plans discussed with the patient and/or family who express understanding and agree with plan of care.   TOTAL TIME  TAKING CARE OF THIS PATIENT: 45 minutes.   [employer]is Jenniah Bhavsar D.O. on  07/27/2016 at 11:55 PM Between 7am to 6pm - Pager - [phone] After 6pm go to [email or web address] - Biomedical engineer  Hospitalists Office [phone] CC: Primary care physician; No PCP Per Patient     Note: This dictation was prepared with Dragon dictation along with smaller phrase technology. Any transcriptional errors that result from this process are unintentional.

## 2016-07-28 LAB — CBC
HEMATOCRIT: 40.8 % (ref 40.0–52.0)
HEMOGLOBIN: 14.3 g/dL (ref 13.0–18.0)
MCH: 30.5 pg (ref 26.0–34.0)
MCHC: 35.1 g/dL (ref 32.0–36.0)
MCV: 87 fL (ref 80.0–100.0)
Platelets: 227 10*3/uL (ref 150–440)
RBC: 4.69 MIL/uL (ref 4.40–5.90)
RDW: 13 % (ref 11.5–14.5)
WBC: 9.3 10*3/uL (ref 3.8–10.6)

## 2016-07-28 LAB — BASIC METABOLIC PANEL
ANION GAP: 5 (ref 5–15)
BUN: 11 mg/dL (ref 6–20)
CALCIUM: 8.8 mg/dL — AB (ref 8.9–10.3)
CO2: 28 mmol/L (ref 22–32)
Chloride: 105 mmol/L (ref 101–111)
Creatinine, Ser: 0.84 mg/dL (ref 0.61–1.24)
GLUCOSE: 105 mg/dL — AB (ref 65–99)
POTASSIUM: 3.4 mmol/L — AB (ref 3.5–5.1)
Sodium: 138 mmol/L (ref 135–145)

## 2016-07-28 MED ORDER — ZOLPIDEM TARTRATE 5 MG PO TABS: 5.0000 mg | ORAL_TABLET | Freq: Every evening | ORAL | Status: DC | PRN

## 2016-07-28 MED ORDER — HYDROCODONE-ACETAMINOPHEN 5-325 MG PO TABS: 1.0000 | ORAL_TABLET | ORAL | Status: DC | PRN

## 2016-07-28 MED ORDER — PIPERACILLIN-TAZOBACTAM 3.375 G IVPB
3.3750 g | Freq: Three times a day (TID) | INTRAVENOUS | Status: DC
  Administered 2016-07-28 – 2016-07-29 (×4): 3.375 g via INTRAVENOUS
  Filled 2016-07-28 (×4): qty 50

## 2016-07-28 MED ORDER — ONDANSETRON HCL 4 MG PO TABS: 4.0000 mg | ORAL_TABLET | Freq: Four times a day (QID) | ORAL | Status: DC | PRN

## 2016-07-28 MED ORDER — SODIUM CHLORIDE 0.9 % IV SOLN
INTRAVENOUS | Status: DC
  Administered 2016-07-28 – 2016-07-29 (×2): via INTRAVENOUS

## 2016-07-28 MED ORDER — ACETAMINOPHEN 325 MG PO TABS: 650.0000 mg | ORAL_TABLET | Freq: Four times a day (QID) | ORAL | Status: DC | PRN

## 2016-07-28 MED ORDER — ONDANSETRON HCL 4 MG/2ML IJ SOLN: 4.0000 mg | Freq: Four times a day (QID) | INTRAMUSCULAR | Status: DC | PRN

## 2016-07-28 MED ORDER — POTASSIUM CHLORIDE CRYS ER 20 MEQ PO TBCR
20.0000 meq | EXTENDED_RELEASE_TABLET | Freq: Once | ORAL | Status: AC
  Administered 2016-07-28: 20 meq via ORAL
  Filled 2016-07-28 (×2): qty 1

## 2016-07-28 MED ORDER — ACETAMINOPHEN 650 MG RE SUPP: 650.0000 mg | Freq: Four times a day (QID) | RECTAL | Status: DC | PRN

## 2016-07-28 MED ORDER — VANCOMYCIN HCL 10 G IV SOLR
1250.0000 mg | Freq: Three times a day (TID) | INTRAVENOUS | Status: DC
  Administered 2016-07-28 – 2016-07-29 (×4): 1250 mg via INTRAVENOUS
  Filled 2016-07-28 (×6): qty 1250

## 2016-07-28 MED ORDER — VANCOMYCIN HCL IN DEXTROSE 1-5 GM/200ML-% IV SOLN
1000.0000 mg | Freq: Once | INTRAVENOUS | Status: AC
  Administered 2016-07-28: 1000 mg via INTRAVENOUS
  Filled 2016-07-28: qty 200

## 2016-07-28 NOTE — Progress Notes (Signed)
[facility] - [place] [facility]                                                                                                                                                                                            Patient Demographics   Marvin Case, is a 30 y.o. male, DOB - May 11, 1986, WUJ:[id number]  Admit date - 07/27/2016   Admitting Physician Tonye Royalty, DO  Outpatient Primary MD for the patient is No PCP Per Patient   LOS - 0  Subjective: Patient with right forearm cellulitis failed outpatient therapy He continues to have erythema in the right forearm. But no drainage noted    Review of Systems:   CONSTITUTIONAL: No documented fever. No fatigue, weakness. No weight gain, no weight loss.  EYES: No blurry or double vision.  ENT: No tinnitus. No postnasal drip. No redness of the oropharynx.  RESPIRATORY: No cough, no wheeze, no hemoptysis. No dyspnea.  CARDIOVASCULAR: No chest pain. No orthopnea. No palpitations. No syncope.  GASTROINTESTINAL: No nausea, no vomiting or diarrhea. No abdominal pain. No melena or hematochezia.  GENITOURINARY: No dysuria or hematuria.  ENDOCRINE: No polyuria or nocturia. No heat or cold intolerance.  HEMATOLOGY: No anemia. No bruising. No bleeding.  INTEGUMENTARY: No rashes. No lesions.  MUSCULOSKELETAL: Right forearm pain with swelling and erythema.  NEUROLOGIC: No numbness, tingling, or ataxia. No seizure-type activity.  PSYCHIATRIC: No anxiety. No insomnia. No ADD.    Vitals:   Vitals:   07/28/16 0022 07/28/16 0023 07/28/16 0051 07/28/16 0728  BP: 122/82 122/82 133/72 127/67  Pulse: 78 86 84 72  Resp:  14 16 18   Temp:   98.2 F (36.8 C) 97.8 F (36.6 C)  TempSrc:   Oral Oral  SpO2: 97% 97% 98% 99%  Weight:   240 lb 12.8 oz (109.2 kg)   Height:        Wt Readings from Last 3 Encounters:  07/28/16 240 lb 12.8 oz (109.2 kg)  07/26/16 230 lb (104.3 kg)  06/23/16 240 lb (108.9 kg)      Intake/Output Summary (Last 24 hours) at 07/28/16 1007 Last data filed at 07/28/16 0140  Gross per 24 hour  Intake           293.75 ml  Output                0 ml  Net           293.75 ml    Physical Exam:   GENERAL: Pleasant-appearing in no apparent distress.  HEAD, EYES, EARS, NOSE AND THROAT: Atraumatic, normocephalic. Extraocular muscles are intact. Pupils  equal and reactive to light. Sclerae anicteric. No conjunctival injection. No oro-pharyngeal erythema.  NECK: Supple. There is no jugular venous distention. No bruits, no lymphadenopathy, no thyromegaly.  HEART: Regular rate and rhythm,. No murmurs, no rubs, no clicks.  LUNGS: Clear to auscultation bilaterally. No rales or rhonchi. No wheezes.  ABDOMEN: Soft, flat, nontender, nondistended. Has good bowel sounds. No hepatosplenomegaly appreciated.  EXTREMITIES:  Right forearm swelling and erythema  NEUROLOGIC: The patient is alert, awake, and oriented x3 with no focal motor or sensory deficits appreciated bilaterally.  SKIN: Moist and warm with no rashes appreciated.  Psych: Not anxious, depressed LN: No inguinal LN enlargement    Antibiotics   Anti-infectives    Start     Dose/Rate Route Frequency Ordered Stop   07/28/16 0800  vancomycin (VANCOCIN) 1,250 mg in sodium chloride 0.9 % 250 mL IVPB     1,250 mg 166.7 mL/hr over 90 Minutes Intravenous Every 8 hours 07/28/16 0051     07/28/16 0400  piperacillin-tazobactam (ZOSYN) IVPB 3.375 g     3.375 g 12.5 mL/hr over 240 Minutes Intravenous Every 8 hours 07/28/16 0051     07/28/16 0100  vancomycin (VANCOCIN) IVPB 1000 mg/200 mL premix     1,000 mg 200 mL/hr over 60 Minutes Intravenous  Once 07/28/16 0051 07/28/16 0206   07/27/16 2115  vancomycin (VANCOCIN) IVPB 1000 mg/200 mL premix     1,000 mg 200 mL/hr over 60 Minutes Intravenous  Once 07/27/16 2104 07/27/16 2253   07/27/16 2115  piperacillin-tazobactam (ZOSYN) IVPB 3.375 g     3.375 g 12.5 mL/hr over 240  Minutes Intravenous  Once 07/27/16 2104 07/27/16 2153      Medications   Scheduled Meds: . piperacillin-tazobactam (ZOSYN)  IV  3.375 g Intravenous Q8H  . vancomycin  1,250 mg Intravenous Q8H   Continuous Infusions: . sodium chloride 125 mL/hr at 07/28/16 0055   PRN Meds:.acetaminophen **OR** acetaminophen, HYDROcodone-acetaminophen, ondansetron **OR** ondansetron (ZOFRAN) IV, zolpidem   Data Review:   Micro Results No results found for this or any previous visit (from the past 240 hour(s)).  Radiology Reports Dg Forearm Right  Result Date: 07/26/2016 CLINICAL DATA:  Acute onset of posterior right forearm erythema and swelling. Initial encounter. EXAM: RIGHT FOREARM - 2 VIEW COMPARISON:  None. FINDINGS: There is no evidence of fracture or dislocation. No osseous erosions are seen. Dorsal and ulnar soft tissue swelling is noted about the forearm. The carpal rows appear grossly intact, demonstrate normal alignment. The elbow joint is unremarkable in appearance. No elbow joint effusion is seen. IMPRESSION: No evidence of fracture or dislocation. No osseous erosions seen. Dorsal and ulnar soft tissue swelling about the forearm. Electronically Signed   By: Roanna Raider M.D.   On: 07/26/2016 22:13     CBC  Recent Labs Lab 07/26/16 2139 07/27/16 2114 07/28/16 0333  WBC 11.9* 10.7* 9.3  HGB 14.7 14.8 14.3  HCT 42.3 41.9 40.8  PLT 241 247 227  MCV 86.8 86.5 87.0  MCH 30.2 30.5 30.5  MCHC 34.8 35.2 35.1  RDW 12.8 12.7 13.0  LYMPHSABS 1.7 1.8  --   MONOABS 0.6 0.9  --   EOSABS 0.1 0.1  --   BASOSABS 0.1 0.1  --     Chemistries   Recent Labs Lab 07/26/16 2139 07/27/16 2114 07/28/16 0333  NA 138 137 138  K 3.3* 4.1 3.4*  CL 104 105 105  CO2 27 29 28   GLUCOSE 112* 96 105*  BUN 14  15 11  CREATININE 0.76 0.79 0.84  CALCIUM 9.1 8.9 8.8*  AST 23 22  --   ALT 21 20  --   ALKPHOS 51 50  --   BILITOT 0.8 0.6  --     ------------------------------------------------------------------------------------------------------------------ estimated creatinine clearance is 154.1 mL/min (by C-G formula based on SCr of 0.84 mg/dL). ------------------------------------------------------------------------------------------------------------------ No results for input(s): HGBA1C in the last 72 hours. ------------------------------------------------------------------------------------------------------------------ No results for input(s): CHOL, HDL, LDLCALC, TRIG, CHOLHDL, LDLDIRECT in the last 72 hours. ------------------------------------------------------------------------------------------------------------------ No results for input(s): TSH, T4TOTAL, T3FREE, THYROIDAB in the last 72 hours.  Invalid input(s): FREET3 ------------------------------------------------------------------------------------------------------------------ No results for input(s): VITAMINB12, FOLATE, FERRITIN, TIBC, IRON, RETICCTPCT in the last 72 hours.  Coagulation profile No results for input(s): INR, PROTIME in the last 168 hours.  No results for input(s): DDIMER in the last 72 hours.  Cardiac Enzymes No results for input(s): CKMB, TROPONINI, MYOGLOBIN in the last 168 hours.  Invalid input(s): CK ------------------------------------------------------------------------------------------------------------------ Invalid input(s): POCBNP    Assessment & Plan   This is a 30 y.o. male with a history of hypertension and anxiety now being admitted with: 1. Cellulitis/abscess of the right forearm, failed outpatient therapy. -Continue therapy with IV Vanco and Zosyn for 1 more day switched to oral tomorrow  2. Hypokalemia replace potassium  3. Miscellaneous Lovenox for DVT prophylaxis     Code Status Orders        Start     Ordered   07/28/16 0045  Full code  Continuous     07/28/16 0044    Code Status History    Date  Active Date Inactive Code Status Order ID Comments User Context   This patient has a current code status but no historical code status.           Consults  noen DVT Prophylaxis  Lovenox   Lab Results  Component Value Date   PLT 227 07/28/2016     Time Spent in minutes   35min  Greater than 50% of time spent in care coordination and counseling patient regarding the condition and plan of care.   Auburn BilberryPATEL, Eligio Angert M.D on 07/28/2016 at 10:07 AM  Between 7am to 6pm - Pager - [phone]  After 6pm go to [email or web address] - password [email or web address] [facility]  [facility]   Office  [phone]

## 2016-07-28 NOTE — Progress Notes (Signed)
Pharmacy Antibiotic Note  Marvin Case is a 30 y.o. male admitted on 07/27/2016 with cellulitis s/p drainage of abscess.  Pharmacy has been consulted for Zosyn and vancomycin dosing.  Plan: 1. Zosyn 3.375 gm IV Q8H EI 2. Vancomycin 1 gm IV x 1 in ED. Will give additional 1 gm IV now on floor for total initial dose of 2 gm, then start 1.25 gm IV Q8H in AM. Predicted trough 12 mcg/mL, max initial dose per protocol, pharmacy will continue to follow and adjust as needed to maintain trough 15 to 20 mcg/mL.   Vd 57.9 L, Ke 0.136 hr-1, T1/2 5.1 hr  Height: 5\' 8"  (172.7 cm) Weight: 230 lb (104.3 kg) IBW/kg (Calculated) : 68.4  Temp (24hrs), Avg:98.7 F (37.1 C), Min:98.7 F (37.1 C), Max:98.7 F (37.1 C)   Recent Labs Lab 07/26/16 2139 07/27/16 2114  WBC 11.9* 10.7*  CREATININE 0.76 0.79    Estimated Creatinine Clearance: 158.1 mL/min (by C-G formula based on SCr of 0.8 mg/dL).    No Known Allergies  Thank you for allowing pharmacy to be a part of this patient's care.  Carola FrostNathan A Catricia Scheerer, Pharm.D., BCPS Clinical Pharmacist 07/28/2016 12:51 AM

## 2016-07-29 LAB — BASIC METABOLIC PANEL
Anion gap: 10 (ref 5–15)
BUN: 11 mg/dL (ref 6–20)
CALCIUM: 8.8 mg/dL — AB (ref 8.9–10.3)
CO2: 24 mmol/L (ref 22–32)
CREATININE: 0.86 mg/dL (ref 0.61–1.24)
Chloride: 105 mmol/L (ref 101–111)
Glucose, Bld: 104 mg/dL — ABNORMAL HIGH (ref 65–99)
Potassium: 3.7 mmol/L (ref 3.5–5.1)
SODIUM: 139 mmol/L (ref 135–145)

## 2016-07-29 LAB — NASAL CULTURE (N/P): SPECIAL REQUESTS: NORMAL

## 2016-07-29 LAB — VANCOMYCIN, TROUGH: VANCOMYCIN TR: 25 ug/mL — AB (ref 15–20)

## 2016-07-29 NOTE — Progress Notes (Signed)
Changed dressing on right elbow area. Had small amount of bloody drainage.

## 2016-07-29 NOTE — Progress Notes (Signed)
Patient was discharged home. IV removed with cath intact. No new scripts, patient to finish course of oral ABX from home. Follow-up in ER in 2 days and wound care including cleanse reviewed.

## 2016-07-29 NOTE — Care Management (Signed)
Saw patient for discharge planning . Resources given for uninsured providers in area. Drug discount card and coupon given for discharge medication . Patient stated that he has meds from last visit and they are the same. Patient is alert and oriented from from home. No other CM needs identified.

## 2016-07-29 NOTE — Discharge Instructions (Signed)

## 2016-07-29 NOTE — Discharge Summary (Signed)
Marvin Case, 30 y.o., DOB January 10, 1986, MRN [id number]. Admission date: 07/27/2016 Discharge Date 07/29/2016 Primary MD No PCP Per Patient Admitting Physician Tonye RoyaltyAlexis Hugelmeyer, DO  Admission Diagnosis  Abscess and cellulitis [L03.90, L02.91]  Discharge Diagnosis   Active Problems:   Cellulitis of arm, right with abscess  Essential hypertension        Hospital Course  Marvin Case is a 30 y.o. male with a known history of Hypertension and anxiety that into the emergency department tonight for worsening of right arm skin infection. Patient was seen in the emergency department day prior and underwent an I&D of an abscess just distal to the right elbow. He received 1 dose of IV antibiotics and was discharged on Bactrim and Keflex. He states that overnight the redness pain and swelling have extended up his arm towards his shoulder. Due to this he was admitted to the hospital started on IV antibiotics. His swelling and erythema significantly improved. He is doing much better he'll be continued on oral antibiotics and was taking previously.           Consults  None  Significant Tests:  See full reports for all details    Dg Forearm Right  Result Date: 07/26/2016 CLINICAL DATA:  Acute onset of posterior right forearm erythema and swelling. Initial encounter. EXAM: RIGHT FOREARM - 2 VIEW COMPARISON:  None. FINDINGS: There is no evidence of fracture or dislocation. No osseous erosions are seen. Dorsal and ulnar soft tissue swelling is noted about the forearm. The carpal rows appear grossly intact, demonstrate normal alignment. The elbow joint is unremarkable in appearance. No elbow joint effusion is seen. IMPRESSION: No evidence of fracture or dislocation. No osseous erosions seen. Dorsal and ulnar soft tissue swelling about the forearm. Electronically Signed   By: Roanna RaiderJeffery  Chang M.D.   On: 07/26/2016 22:13       Today   Subjective:   Marvin Case  patient feels better most of the  erythema involving the right elbow is resolved  Objective:   Blood pressure 121/63, pulse 79, temperature 97.8 F (36.6 C), temperature source Oral, resp. rate 18, height 5\' 8"  (1.727 m), weight 240 lb 12.8 oz (109.2 kg), SpO2 98 %.  .  Intake/Output Summary (Last 24 hours) at 07/29/16 1144 Last data filed at 07/28/16 1950  Gross per 24 hour  Intake          2870.84 ml  Output                0 ml  Net          2870.84 ml    Exam VITAL SIGNS: Blood pressure 121/63, pulse 79, temperature 97.8 F (36.6 C), temperature source Oral, resp. rate 18, height 5\' 8"  (1.727 m), weight 240 lb 12.8 oz (109.2 kg), SpO2 98 %.  GENERAL:  30 y.o.-year-old patient lying in the bed with no acute distress.  EYES: Pupils equal, round, reactive to light and accommodation. No scleral icterus. Extraocular muscles intact.  HEENT: Head atraumatic, normocephalic. Oropharynx and nasopharynx clear.  NECK:  Supple, no jugular venous distention. No thyroid enlargement, no tenderness.  LUNGS: Normal breath sounds bilaterally, no wheezing, rales,rhonchi or crepitation. No use of accessory muscles of respiration.  CARDIOVASCULAR: S1, S2 normal. No murmurs, rubs, or gallops.  ABDOMEN: Soft, nontender, nondistended. Bowel sounds present. No organomegaly or mass.  EXTREMITIES: No pedal edema, cyanosis, or clubbing.  NEUROLOGIC: Cranial nerves II through XII are intact. Muscle strength 5/5 in all extremities. Sensation intact.  Gait not checked.  PSYCHIATRIC: The patient is alert and oriented x 3.  SKIN: Right elbow most of the erythema is resolved   Data Review     CBC w Diff: Lab Results  Component Value Date   WBC 9.3 07/28/2016   HGB 14.3 07/28/2016   HGB 15.4 09/28/2014   HCT 40.8 07/28/2016   HCT 45.9 09/28/2014   PLT 227 07/28/2016   PLT 201 09/28/2014   LYMPHOPCT 17 07/27/2016   LYMPHOPCT 7.4 09/28/2014   MONOPCT 9 07/27/2016   MONOPCT 8.8 09/28/2014   EOSPCT 1 07/27/2016   EOSPCT 0.0 09/28/2014    BASOPCT 1 07/27/2016   BASOPCT 0.5 09/28/2014   CMP: Lab Results  Component Value Date   NA 139 07/29/2016   NA 134 (L) 09/28/2014   K 3.7 07/29/2016   K 3.4 (L) 09/28/2014   CL 105 07/29/2016   CL 101 09/28/2014   CO2 24 07/29/2016   CO2 25 09/28/2014   BUN 11 07/29/2016   BUN 10 09/28/2014   CREATININE 0.86 07/29/2016   CREATININE 1.09 09/28/2014   PROT 7.2 07/27/2016   PROT 7.6 09/28/2014   ALBUMIN 4.0 07/27/2016   ALBUMIN 3.9 09/28/2014   BILITOT 0.6 07/27/2016   BILITOT 0.7 09/28/2014   ALKPHOS 50 07/27/2016   ALKPHOS 64 09/28/2014   AST 22 07/27/2016   AST 16 09/28/2014   ALT 20 07/27/2016   ALT 30 09/28/2014  .  Micro Results Recent Results (from the past 240 hour(s))  Nasal culture     Status: Abnormal   Collection Time: 07/28/16  1:16 AM  Result Value Ref Range Status   Specimen Description NASOPHARYNGEAL  Final   Special Requests Normal  Final   Culture METHICILLIN RESISTANT STAPHYLOCOCCUS AUREUS (A)  Final   Report Status 07/29/2016 FINAL  Final        Code Status Orders        Start     Ordered   07/28/16 0045  Full code  Continuous     07/28/16 0044    Code Status History    Date Active Date Inactive Code Status Order ID Comments User Context   This patient has a current code status but no historical code status.          Follow-up Information    [facility] EMERGENCY DEPARTMENT Follow up in 2 day(s).   Specialty:  Emergency Medicine Why:  For wound re-check, As needed Contact information: [id number] [place] [address] [phone]          Discharge Medications     Medication List    TAKE these medications   acetaminophen 500 MG tablet Commonly known as:  TYLENOL Take 1,000 mg by mouth daily as needed for headache.   cephALEXin 500 MG capsule Commonly known as:  KEFLEX Take 1 capsule (500 mg total) by mouth 2 (two) times daily.   mupirocin ointment 2  % Commonly known as:  BACTROBAN Apply to affected area 3 times daily   sulfamethoxazole-trimethoprim 800-160 MG tablet Commonly known as:  BACTRIM DS,SEPTRA DS Take 1 tablet by mouth 2 (two) times daily.   traMADol 50 MG tablet Commonly known as:  ULTRAM Take 1 tablet (50 mg total) by mouth every 6 (six) hours as needed.          Total Time in preparing paper work, data evaluation and todays exam - 35 minutes  Auburn Bilberry M.D on 07/29/2016 at 11:44  AM  [facility]   Office  [phone]

## 2016-07-31 ENCOUNTER — Emergency Department
Admission: EM | Admit: 2016-07-31 | Discharge: 2016-07-31 | Disposition: A | Discharge status: Home / Self Care | Payer: Self-pay | Attending: Emergency Medicine | Admitting: Emergency Medicine

## 2016-07-31 ENCOUNTER — Encounter: Payer: Self-pay | Admitting: *Deleted

## 2016-07-31 DIAGNOSIS — Z4801 Encounter for change or removal of surgical wound dressing: Secondary | ICD-10-CM | POA: Insufficient documentation

## 2016-07-31 DIAGNOSIS — Z09 Encounter for follow-up examination after completed treatment for conditions other than malignant neoplasm: Secondary | ICD-10-CM

## 2016-07-31 DIAGNOSIS — I10 Essential (primary) hypertension: Secondary | ICD-10-CM | POA: Insufficient documentation

## 2016-07-31 NOTE — ED Notes (Signed)
AAOx3.  Skin warm and dry.  NAD 

## 2016-07-31 NOTE — ED Triage Notes (Signed)
Pt had abscess on right arm, pt was discharged from inpatient , pt is here for wound recheck

## 2016-07-31 NOTE — Discharge Instructions (Signed)
Keep wound covered until fully closed

## 2016-07-31 NOTE — ED Provider Notes (Signed)
[facility] Emergency Department Provider Note  ____________________________________________  Time seen: Approximately 4:49 PM  I have reviewed the triage vital signs and the nursing notes.   HISTORY  Chief Complaint Wound Check    HPI Marvin Case is a 30 y.o. male , NAD, presents to the emergency department for wound recheck after incision and drainage. Was seen in this emergency department initially on September 1 for incision and drainage of a lesion on the right arm. Was given IV antibiotics and discharged on oral Keflex and Bactrim. Return to the emergency department the following day as the redness and swelling have worsened despite being on antibiotics. He was then admitted to the hospital for further IV antibiotics and monitoring. Was discharged on the same Keflex and Bactrim by mouth and his continued such following discharge. Has noted that the area looks significantly better and has healed well over the last few days. Has continued on oral Keflex and Bactrim without any side effects. Has had no abdominal pain, nausea, vomiting, diarrhea. Has not noted any rashes or itching. Denies any oozing or weeping from the wound. Has not had any fevers, chills, body aches. States he presents today for wound recheck as advised at the time of hospital discharge.   Past Medical History:  Diagnosis Date  . Anxiety   . Hypertension   . Panic attacks     Patient Active Problem List   Diagnosis Date Noted  . Cellulitis of arm, right 07/27/2016    Past Surgical History:  Procedure Laterality Date  . ANKLE FRACTURE SURGERY    . ANKLE SURGERY    . KNEE SURGERY    . KNEE SURGERY      Prior to Admission medications   Medication Sig Start Date End Date Taking? Authorizing Provider  acetaminophen (TYLENOL) 500 MG tablet Take 1,000 mg by mouth daily as needed for headache.    Historical Provider, MD  cephALEXin (KEFLEX) 500 MG capsule Take 1 capsule (500 mg total)  by mouth 2 (two) times daily. 07/26/16   Jene Everyobert Kinner, MD  mupirocin ointment Idelle Jo(BACTROBAN) 2 % Apply to affected area 3 times daily 07/27/16 07/27/17  Emily FilbertJonathan E Williams, MD  sulfamethoxazole-trimethoprim (BACTRIM DS,SEPTRA DS) 800-160 MG tablet Take 1 tablet by mouth 2 (two) times daily. 07/26/16   Jene Everyobert Kinner, MD  traMADol (ULTRAM) 50 MG tablet Take 1 tablet (50 mg total) by mouth every 6 (six) hours as needed. 07/26/16 07/26/17  Jene Everyobert Kinner, MD    Allergies Review of patient's allergies indicates no known allergies.  No family history on file.  Social History Social History  Substance Use Topics  . Smoking status: Never Smoker  . Smokeless tobacco: Never Used  . Alcohol use No     Review of Systems  Constitutional: No fever/chills Cardiovascular: No chest pain. Respiratory:  No shortness of breath. Gastrointestinal: No abdominal pain.  No nausea, vomiting.  No diarrhea. Musculoskeletal: Negative for right forearm pain.  Skin: Positive incision and drainage wound right forearm. Negative for rash, redness, swelling, pain. Neurological: Negative for numbness, wheezes, tingling. 10-point ROS otherwise negative.  ____________________________________________   PHYSICAL EXAM:  VITAL SIGNS: ED Triage Vitals  Enc Vitals Group     BP 07/31/16 1606 138/84     Pulse Rate 07/31/16 1606 86     Resp 07/31/16 1606 18     Temp 07/31/16 1606 98.4 F (36.9 C)     Temp Source 07/31/16 1606 Oral     SpO2 07/31/16 1606  99 %     Weight 07/31/16 1603 240 lb (108.9 kg)     Height 07/31/16 1603 5\' 8"  (1.727 m)     Head Circumference --      Peak Flow --      Pain Score --      Pain Loc --      Pain Edu? --      Excl. in GC? --      Constitutional: Alert and oriented. Well appearing and in no acute distress. Eyes: Conjunctivae are normal.  Head: Atraumatic. Cardiovascular: Good peripheral circulationIn the right upper extremity with 2+ pulses.  Respiratory: Normal respiratory effort  without tachypnea or retractions.  Musculoskeletal: Full range of motion of the right upper extremity without pain or difficulty. Neurologic:  Normal speech and language. No gross focal neurologic deficits are appreciated.  Skin:  0.3cm superficial incision wound noted about the right forearm with clear drainage. No tenderness to palpation. No induration or fluctuance is noted. No streaking. Skin is warm, dry. No rash noted. Psychiatric: Mood and affect are normal. Speech and behavior are normal. Patient exhibits appropriate insight and judgement.   ____________________________________________   LABS  None ____________________________________________  EKG  None ____________________________________________  RADIOLOGY  None ____________________________________________    PROCEDURES  Procedure(s) performed: None   Procedures   Medications - No data to display   ____________________________________________   INITIAL IMPRESSION / ASSESSMENT AND PLAN / ED COURSE  Pertinent labs & imaging results that were available during my care of the patient were reviewed by me and considered in my medical decision making (see chart for details).  Clinical Course    Patient's diagnosis is consistent with Encounter for recheck of abscess following incision and drainage. Patient will be discharged home with instructions to continue all antibiotics as previously prescribed to completion. Keep wound clean and dry as well as covered until fully healed. Patient is to follow up with [facility] clinic as needed if symptoms persist past this treatment course. Patient is given ED precautions to return to the ED for any worsening or new symptoms.    ____________________________________________  FINAL CLINICAL IMPRESSION(S) / ED DIAGNOSES  Final diagnoses:  Encounter for recheck of abscess following incision and drainage      NEW MEDICATIONS STARTED DURING THIS VISIT:  New Prescriptions    No medications on file         Hope Pigeon, PA-C 07/31/16 1705    Loleta Rose, MD 07/31/16 2249

## 2016-07-31 NOTE — ED Notes (Signed)
Reports I&D of wound on right fa 3 days ago.  States he was told to come in for a wound check.  States he feels like it is improving well.  No pus or redness noted.

## 2016-08-03 ENCOUNTER — Encounter (HOSPITAL_COMMUNITY): Payer: Self-pay

## 2016-08-03 ENCOUNTER — Emergency Department (HOSPITAL_COMMUNITY)
Admission: EM | Admit: 2016-08-03 | Discharge: 2016-08-03 | Disposition: A | Discharge status: Home / Self Care | Payer: Self-pay | Attending: Emergency Medicine | Admitting: Emergency Medicine

## 2016-08-03 DIAGNOSIS — F419 Anxiety disorder, unspecified: Secondary | ICD-10-CM | POA: Insufficient documentation

## 2016-08-03 DIAGNOSIS — Z5181 Encounter for therapeutic drug level monitoring: Secondary | ICD-10-CM | POA: Insufficient documentation

## 2016-08-03 DIAGNOSIS — I10 Essential (primary) hypertension: Secondary | ICD-10-CM | POA: Insufficient documentation

## 2016-08-03 DIAGNOSIS — F4323 Adjustment disorder with mixed anxiety and depressed mood: Secondary | ICD-10-CM | POA: Diagnosis present

## 2016-08-03 LAB — RAPID URINE DRUG SCREEN, HOSP PERFORMED
AMPHETAMINES: NOT DETECTED
BENZODIAZEPINES: NOT DETECTED
Barbiturates: NOT DETECTED
COCAINE: NOT DETECTED
Opiates: NOT DETECTED
Tetrahydrocannabinol: NOT DETECTED

## 2016-08-03 MED ORDER — HYDROXYZINE HCL 25 MG PO TABS: 25.0000 mg | ORAL_TABLET | Freq: Four times a day (QID) | ORAL | 0 refills | Status: AC | PRN

## 2016-08-03 MED ORDER — HYDROXYZINE HCL 25 MG PO TABS: 25.0000 mg | ORAL_TABLET | Freq: Four times a day (QID) | ORAL | Status: DC | PRN

## 2016-08-03 MED ORDER — LORAZEPAM 1 MG PO TABS: 0.5000 mg | ORAL_TABLET | Freq: Three times a day (TID) | ORAL | 0 refills | Status: DC | PRN

## 2016-08-03 NOTE — BH Assessment (Signed)
Patient does not meet inpatient criteria for psychiatric admission for Dr. Lucianne MussKumar. Discussed discharge with patient and recommended outpatient treatment. Provided patient with resources for outpatient treatment and added resources to patients discharge instructions. Patient states that he feels comfortable following up with outpatient treatment and calling to request an appointment. Patient denies questions or concerns at this time. Informed patients nurse of patients discharge.   Davina PokeJoVea Rubie Ficco, LCSW Therapeutic Triage Specialist [facility] 08/03/2016 11:23 AM

## 2016-08-03 NOTE — BHH Suicide Risk Assessment (Signed)
Suicide Risk Assessment  Discharge Assessment   [facility] Discharge Suicide Risk Assessment   Principal Problem: Adjustment disorder with mixed anxiety and depressed mood Discharge Diagnoses:  Patient Active Problem List   Diagnosis Date Noted  . Adjustment disorder with mixed anxiety and depressed mood [F43.23] 08/03/2016    Priority: High  . Cellulitis of arm, right [L03.113] 07/27/2016    Total Time spent with patient: 45 minutes   Musculoskeletal: Strength & Muscle Tone: within normal limits Gait & Station: normal Patient leans: N/A  Psychiatric Specialty Exam: Physical Exam  Constitutional: He is oriented to person, place, and time. He appears well-developed and well-nourished.  HENT:  Head: Normocephalic.  Neck: Normal range of motion.  Respiratory: Effort normal.  Musculoskeletal: Normal range of motion.  Neurological: He is alert and oriented to person, place, and time.  Skin: Skin is warm and dry.  Psychiatric: He has a normal mood and affect. His speech is normal and behavior is normal. Judgment and thought content normal. Cognition and memory are normal.    Review of Systems  Constitutional: Negative.   HENT: Negative.   Eyes: Negative.   Respiratory: Negative.   Cardiovascular: Negative.   Gastrointestinal: Negative.   Genitourinary: Negative.   Musculoskeletal: Negative.   Skin: Negative.   Neurological: Negative.   Endo/Heme/Allergies: Negative.   Psychiatric/Behavioral: The patient is nervous/anxious.     Blood pressure 139/86, pulse 103, temperature 98.2 F (36.8 C), temperature source Oral, resp. rate 20, height 5\' 10"  (1.778 m), weight 108.9 kg (240 lb), SpO2 100 %.Body mass index is 34.44 kg/m.  General Appearance: Casual  Eye Contact:  Good  Speech:  Normal Rate  Volume:  Normal  Mood:  Anxious  Affect:  Congruent  Thought Process:  Coherent and Descriptions of Associations: Intact  Orientation:  Full (Time, Place, and Person)  Thought Content:   WDL  Suicidal Thoughts:  No  Homicidal Thoughts:  No  Memory:  Immediate;   Good Recent;   Good Remote;   Good  Judgement:  Fair  Insight:  Fair  Psychomotor Activity:  Normal  Concentration:  Concentration: Good and Attention Span: Good  Recall:  Good  Fund of Knowledge:  Good  Language:  Good  Akathisia:  No  Handed:  Right  AIMS (if indicated):     Assets:  Leisure Time Physical Health Resilience Social Support  ADL's:  Intact  Cognition:  WNL  Sleep:      Mental Status Per Nursing Assessment::   On Admission:   altercation with his wife and threat of overdose  Demographic Factors:  Male and Caucasian, young adult  Loss Factors: NA  Historical Factors: NA  Risk Reduction Factors:   Responsible for children under 56 years of age, Sense of responsibility to family, Living with another person, especially a relative and Positive social support  Continued Clinical Symptom Anxiety, mild  Cognitive Features That Contribute To Risk:  None    Suicide Risk:  Minimal: No identifiable suicidal ideation.  Patients presenting with no risk factors but with morbid ruminations; may be classified as minimal risk based on the severity of the depressive symptoms  Follow-up Information    Schedule an appointment as soon as possible for a visit  with A therapist or counselor.   Why:  For follow up       [facility]-EMERGENCY DEPT.   Specialty:  Emergency Medicine Why:  As needed, if symptoms worsen Contact information: [id number]  [place] [id number] [phone]          Plan Of Care/Follow-up recommendations:  Activity:  as tolerated Diet:  heart healthy diet  LORD, JAMISON, NP 08/03/2016, 4:59 PM

## 2016-08-03 NOTE — ED Provider Notes (Signed)
[facility]-EMERGENCY DEPT Provider Note   CSN: [id number] Arrival date & time: 08/03/16  0356    History   Chief Complaint Chief Complaint  Patient presents with  . Medical Clearance    HPI RYELAN KAZEE is a 29 y.o. male.  30 year old male with reported history of anxiety presents to the emergency department for psychiatric evaluation. He reports that he had an argument with his wife tonight. He believes that this worsened his anxiety. He reports "saying some things I shouldn't have said". Patient states that he took a bottle of tablets and shook the bottle because he "knew my wife would come talk to me". He denies any suicidal ideation at this time or plan to harm himself. He denies any homicidal thoughts. Patient states that he is to be on Ativan for anxiety, but has been off of this medication for a few months. He believes that it has helped in situations like this before. Wife called [employer] who transported the patient to the ED tonight. Patient is VOLUNTARY.   The history is provided by the patient. No language interpreter was used.    Past Medical History:  Diagnosis Date  . Anxiety   . Hypertension   . Panic attacks     Patient Active Problem List   Diagnosis Date Noted  . Cellulitis of arm, right 07/27/2016    Past Surgical History:  Procedure Laterality Date  . ANKLE FRACTURE SURGERY    . ANKLE SURGERY    . KNEE SURGERY    . KNEE SURGERY        Home Medications    Prior to Admission medications   Medication Sig Start Date End Date Taking? Authorizing Provider  cephALEXin (KEFLEX) 500 MG capsule Take 1 capsule (500 mg total) by mouth 2 (two) times daily. Patient not taking: Reported on 08/03/2016 07/26/16   Jene Every, MD  mupirocin ointment Idelle Jo) 2 % Apply to affected area 3 times daily Patient not taking: Reported on 08/03/2016 07/27/16 07/27/17  Emily Filbert, MD  sulfamethoxazole-trimethoprim (BACTRIM DS,SEPTRA DS) 800-160 MG tablet Take 1 tablet by mouth  2 (two) times daily. Patient not taking: Reported on 08/03/2016 07/26/16   Jene Every, MD  traMADol (ULTRAM) 50 MG tablet Take 1 tablet (50 mg total) by mouth every 6 (six) hours as needed. Patient not taking: Reported on 08/03/2016 07/26/16 07/26/17  Jene Every, MD    Family History No family history on file.  Social History Social History  Substance Use Topics  . Smoking status: Never Smoker  . Smokeless tobacco: Never Used  . Alcohol use No     Allergies   Review of patient's allergies indicates no known allergies.   Review of Systems Review of Systems  Psychiatric/Behavioral: Positive for behavioral problems. Negative for suicidal ideas.  Ten systems reviewed and are negative for acute change, except as noted in the HPI.    Physical Exam Updated Vital Signs BP 139/86 (BP Location: Right Arm)   Pulse 103   Temp 98.2 F (36.8 C) (Oral)   Resp 20   Ht 5\' 10"  (1.778 m)   Wt 108.9 kg   SpO2 100%   BMI 34.44 kg/m   Physical Exam  Constitutional: He is oriented to person, place, and time. He appears well-developed and well-nourished. No distress.  Nontoxic appearing  HENT:  Head: Normocephalic and atraumatic.  Eyes: Conjunctivae and EOM are normal. No scleral icterus.  Neck: Normal range of motion.  Pulmonary/Chest: Effort normal. No respiratory distress.  Respirations even and unlabored  Musculoskeletal: Normal range of motion.  Neurological: He is alert and oriented to person, place, and time.  Skin: Skin is warm and dry. No rash noted. He is not diaphoretic. No erythema. No pallor.  Psychiatric: His speech is normal and behavior is normal. His mood appears anxious. He expresses no homicidal and no suicidal ideation.  No SI/HI. Mildly anxious and slightly tearful when discussing the events surrounding his presentation.  Nursing note and vitals reviewed.    ED Treatments / Results  Labs (all labs ordered are listed, but only abnormal results are displayed) Labs  Reviewed - No data to display  EKG  EKG Interpretation None       Radiology No results found.  Procedures Procedures (including critical care time)  Medications Ordered in ED Medications - No data to display   Initial Impression / Assessment and Plan / ED Course  I have reviewed the triage vital signs and the nursing notes.  Pertinent labs & imaging results that were available during my care of the patient were reviewed by me and considered in my medical decision making (see chart for details).  Clinical Course    Patient pending TTS recommendations for disposition. Patient signed out to Will Dansie, PA-C at shift change who will follow up on recommendations and disposition appropriately.   Final Clinical Impressions(s) / ED Diagnoses   Final diagnoses:  Anxiety    New Prescriptions New Prescriptions   No medications on file     Antony MaduraKelly Lameka Disla, PA-C 08/03/16 [id number]    Gilda Creasehristopher J Pollina, MD 08/04/16 [phone]

## 2016-08-03 NOTE — ED Notes (Signed)
Pt discharged home. Discharged instructions read to pt who verbalized understanding. All belongings returned to pt who signed for same. Denies SI/HI, is not delusional and not responding to internal stimuli. Escorted pt to the ED exit.    
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
## 2016-08-03 NOTE — ED Notes (Signed)
On admission to the [facility] pt was tearful and worried about not getting discharged in time to take over child care at the house so that his wife can go to work. He was tearful because his wife just served separation paper. He said that they were arguing at home and in the heat of the moment he threatened suicide. Now, he said that he does not want to kill himself. He just wants to go home. Denies HI. Pt does not appear to be delusional and is not responding to internal stimuli.

## 2016-08-03 NOTE — ED Notes (Signed)
Bed: WTR5 Expected date:  Expected time:  Means of arrival:  Comments: 

## 2016-08-03 NOTE — Discharge Instructions (Signed)
[facility] ° ° °AGENCIES ° °[facility] (Medication management, therapy, and crisis services) °ACCESS LINE:  [phone] or [phone] °[facility] °[address] °[place], [place] [address] ° °[facility] (Medication management and therapy) °[address] °[place], [place] [address] °[phone] ° °[facility] (Medication management and therapy) °[address] °[place], [place] [address] °[phone] ° °THERAPISTS (No medication management) ° °[facility]  [facility] °Call for location of [place] office  [address] °[phone]      [place], [place] [address] °       [phone] ° °MOBILE CRISIS TEAMS ° °[facility] °Mobile Crisis Care Unit °[phone] ° ° °These referrals have been provided to you as appropriate for your clinical needs while taking into account your financial concerns. Please be aware that agencies, practitioners and insurance companies sometimes change contracts. When calling to make an appointment have your insurance information available so the professional you are going to see can confirm whether they are covered by your plan. Take this form with you in case the person you are seeing needs a copy or to contact us. °

## 2016-08-03 NOTE — ED Triage Notes (Signed)
Patient [place] [employer] [place].  [facility] was called to the house by wife stating that patient was threatening harm to himself.  Per wife patient had her pills and was threatening to taken them.

## 2016-08-03 NOTE — ED Notes (Signed)
Patient states that he and wife got into an argument, he said something's he should not have said.  Patient denies taking any drugs and denies alcohol use.  Denies SI/HI, Denies A/V/H.
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
## 2016-08-24 IMAGING — CR DG CHEST 2V
2 series · 2 of 2 positions shown · non-contrast
Comparison: 09/26/2015

CLINICAL DATA: Cough and chest soreness

EXAM:
CHEST  2 VIEW

[chest pa]
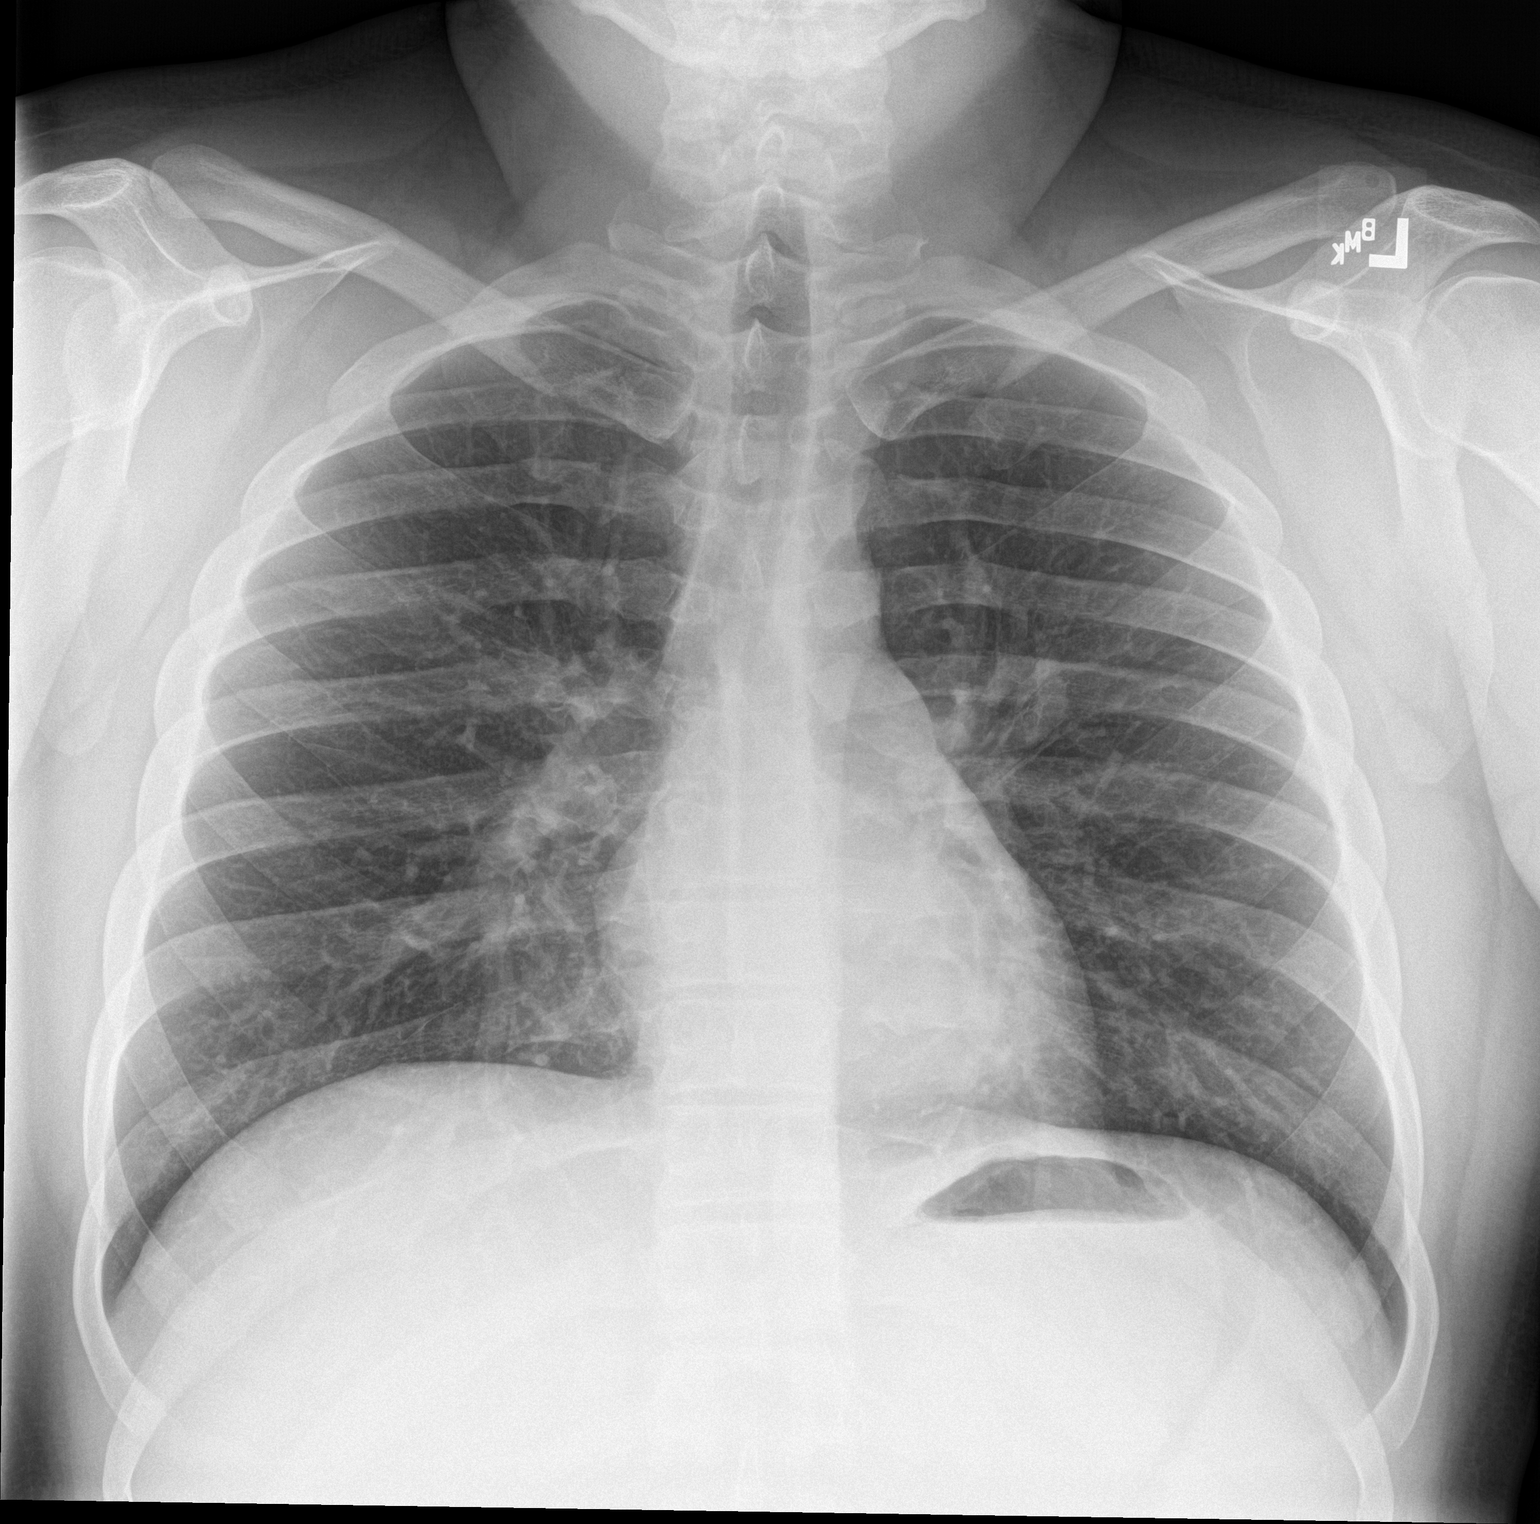

[chest lat]
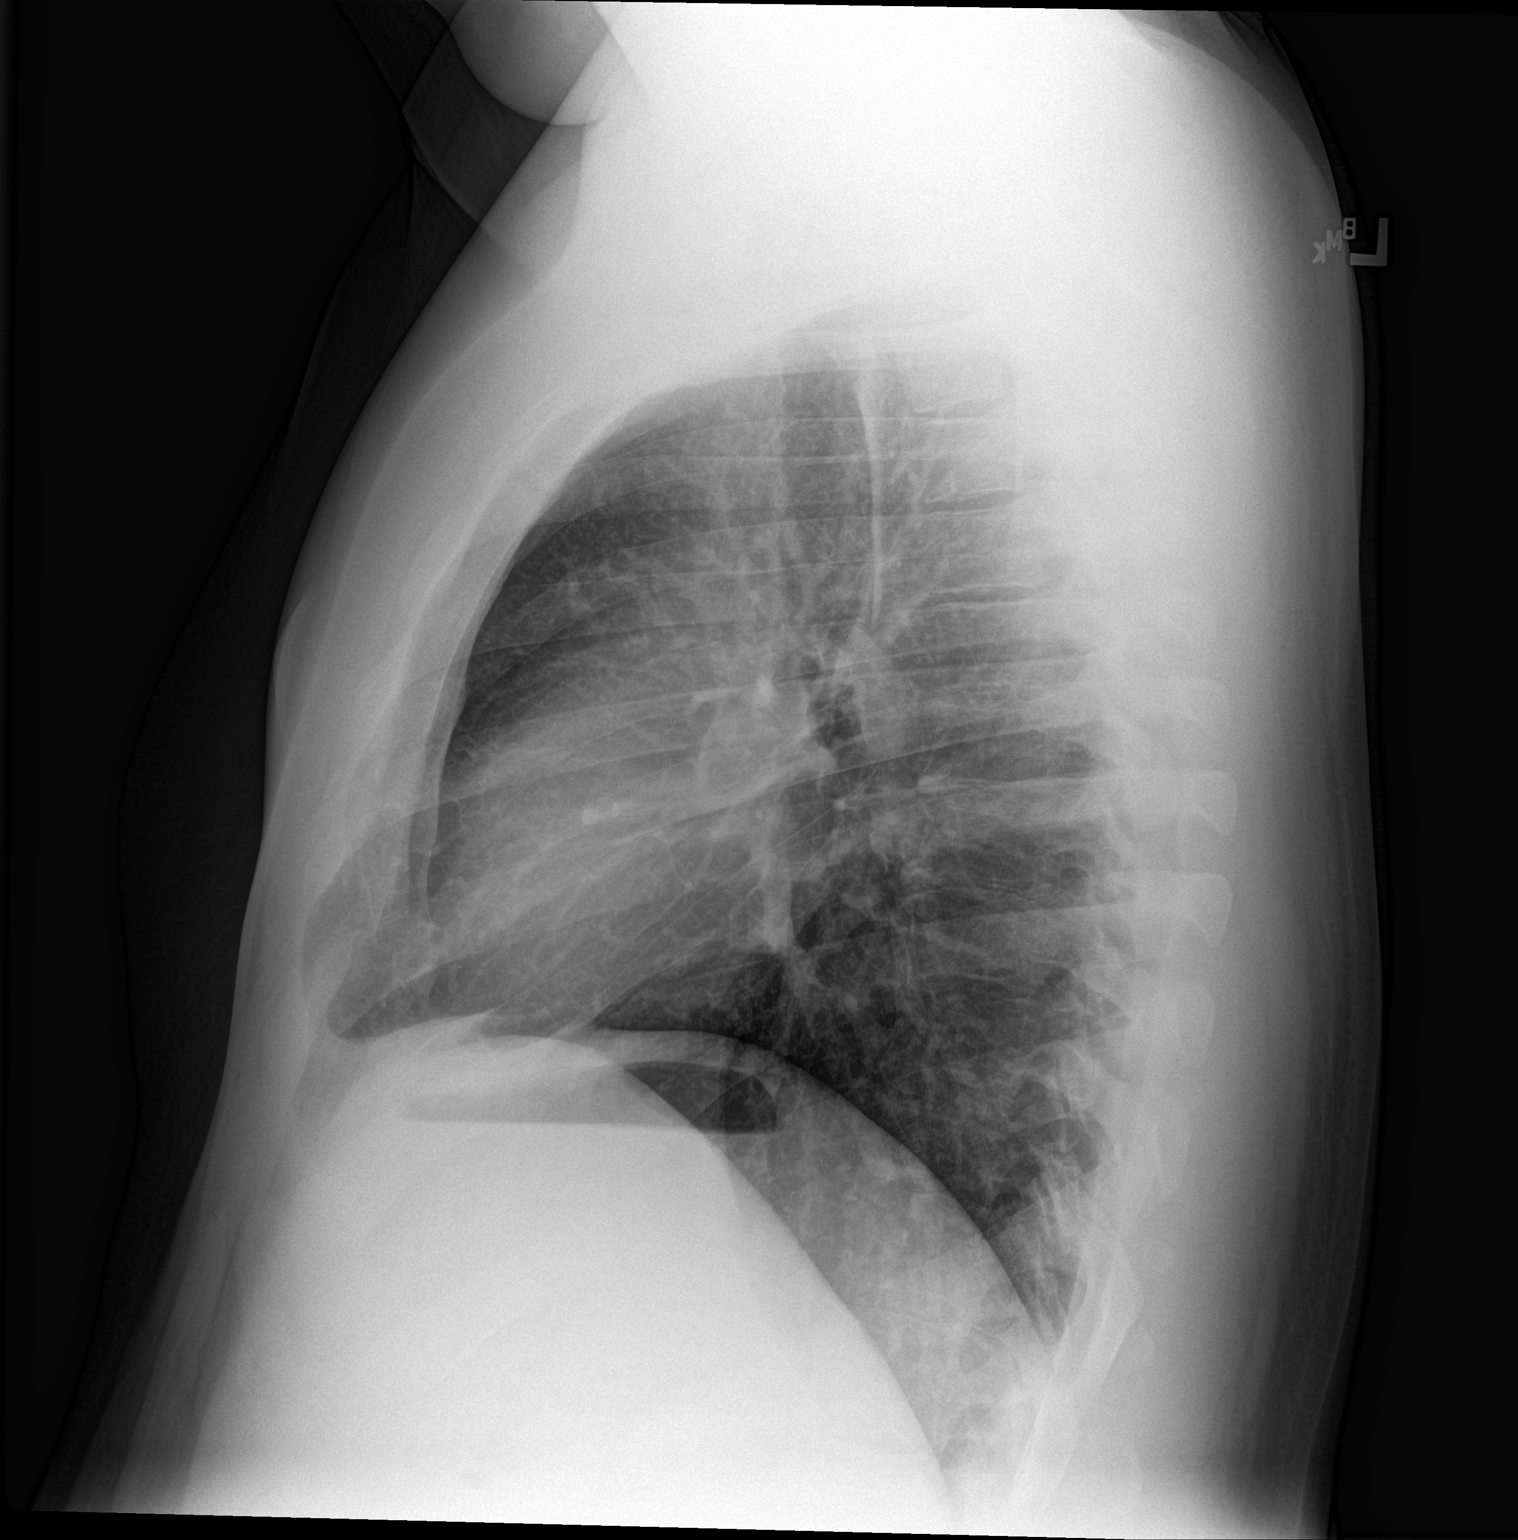

[2 of 2 positions shown; findings below may reference images not displayed]

FINDINGS: Normal heart size and mediastinal contours. No acute infiltrate or
edema. No effusion or pneumothorax. No acute osseous findings.
IMPRESSION: Negative chest.

## 2016-10-30 ENCOUNTER — Emergency Department: Payer: Self-pay

## 2016-10-30 ENCOUNTER — Emergency Department
Admission: EM | Admit: 2016-10-30 | Discharge: 2016-10-30 | Disposition: A | Discharge status: Home / Self Care | Payer: Self-pay | Attending: Emergency Medicine | Admitting: Emergency Medicine

## 2016-10-30 DIAGNOSIS — Z5321 Procedure and treatment not carried out due to patient leaving prior to being seen by health care provider: Secondary | ICD-10-CM | POA: Insufficient documentation

## 2016-10-30 DIAGNOSIS — I10 Essential (primary) hypertension: Secondary | ICD-10-CM | POA: Insufficient documentation

## 2016-10-30 DIAGNOSIS — R51 Headache: Secondary | ICD-10-CM | POA: Insufficient documentation

## 2016-10-30 LAB — BASIC METABOLIC PANEL
Anion gap: 7 (ref 5–15)
BUN: 13 mg/dL (ref 6–20)
CHLORIDE: 103 mmol/L (ref 101–111)
CO2: 26 mmol/L (ref 22–32)
CREATININE: 0.93 mg/dL (ref 0.61–1.24)
Calcium: 9 mg/dL (ref 8.9–10.3)
GFR calc Af Amer: >60 mL/min (ref >60)
GFR calc non Af Amer: >60 mL/min (ref >60)
Glucose, Bld: 109 mg/dL — ABNORMAL HIGH (ref 65–99)
Potassium: 3.6 mmol/L (ref 3.5–5.1)
Sodium: 136 mmol/L (ref 135–145)

## 2016-10-30 LAB — CBC
HEMATOCRIT: 47 % (ref 40.0–52.0)
Hemoglobin: 16.5 g/dL (ref 13.0–18.0)
MCH: 30 pg (ref 26.0–34.0)
MCHC: 35.1 g/dL (ref 32.0–36.0)
MCV: 85.6 fL (ref 80.0–100.0)
PLATELETS: 234 10*3/uL (ref 150–440)
RBC: 5.49 MIL/uL (ref 4.40–5.90)
RDW: 12.6 % (ref 11.5–14.5)
WBC: 11.7 10*3/uL — ABNORMAL HIGH (ref 3.8–10.6)

## 2016-10-30 LAB — TROPONIN I: Troponin I: <0.03 ng/mL (ref <0.03)

## 2016-10-30 NOTE — ED Triage Notes (Addendum)
Patient c/o frontal headache for 3 days. Patient c/o feeling tired. Patient also c/o mild chest discomfort, but denies it at this time.

## 2016-11-17 IMAGING — DX DG FOREARM 2V*R*
2 series · 2 of 2 positions shown · non-contrast
Comparison: None.

CLINICAL DATA: Acute onset of posterior right forearm erythema and
swelling. Initial encounter.

EXAM:
RIGHT FOREARM - 2 VIEW

[forearm ap]
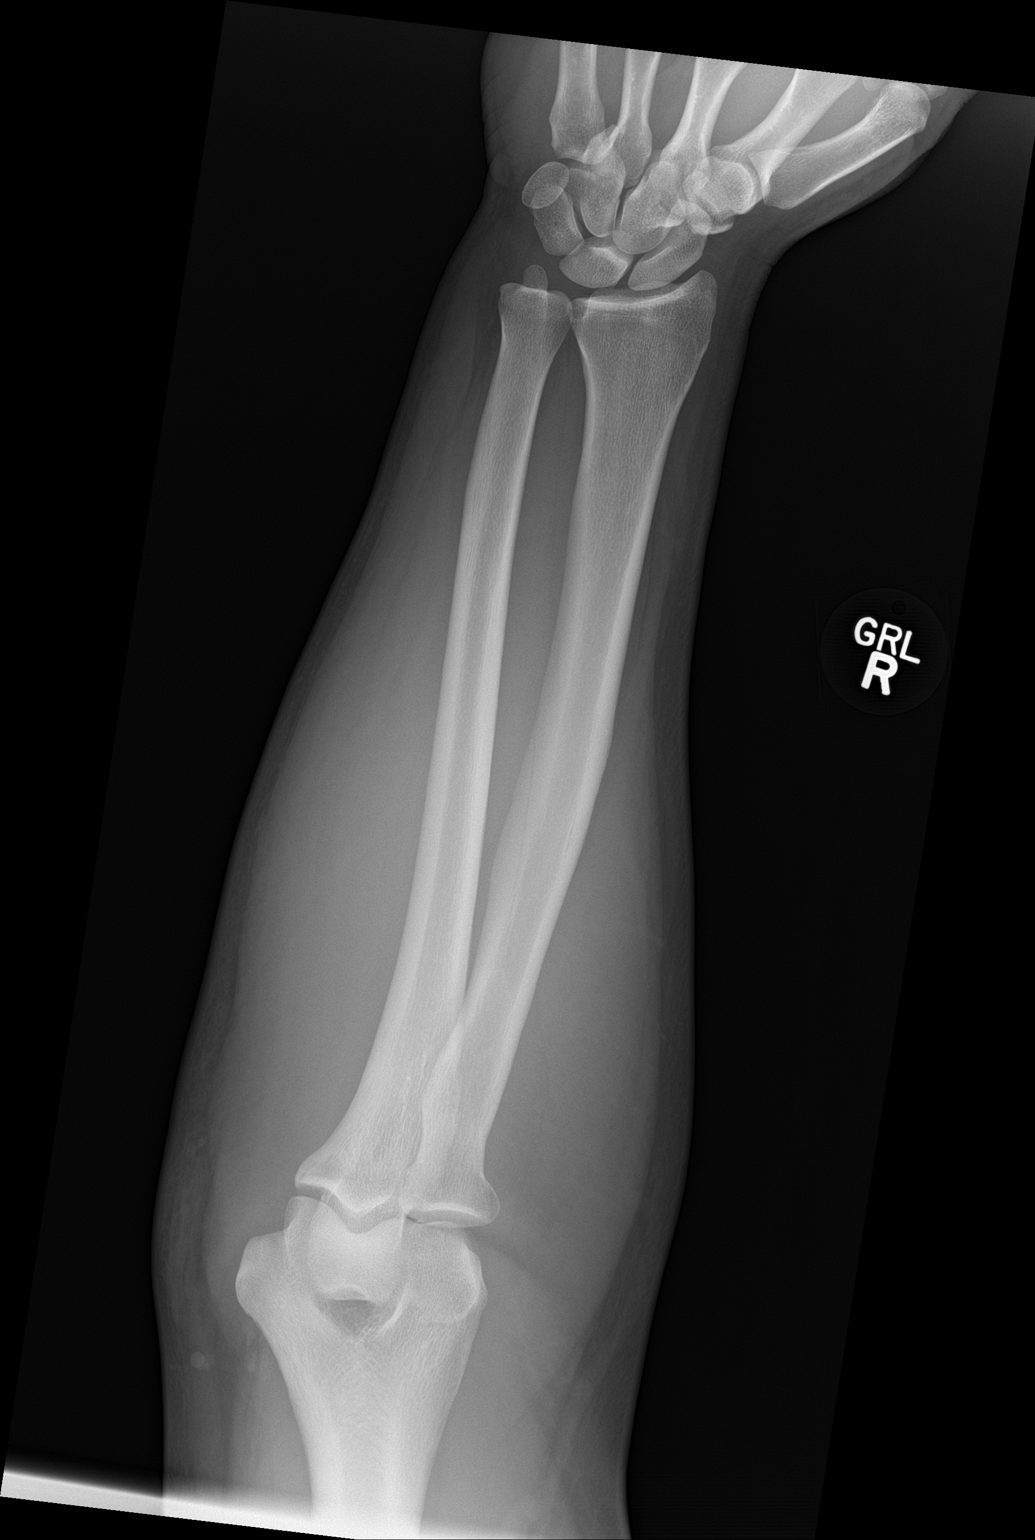

[forearm lat]
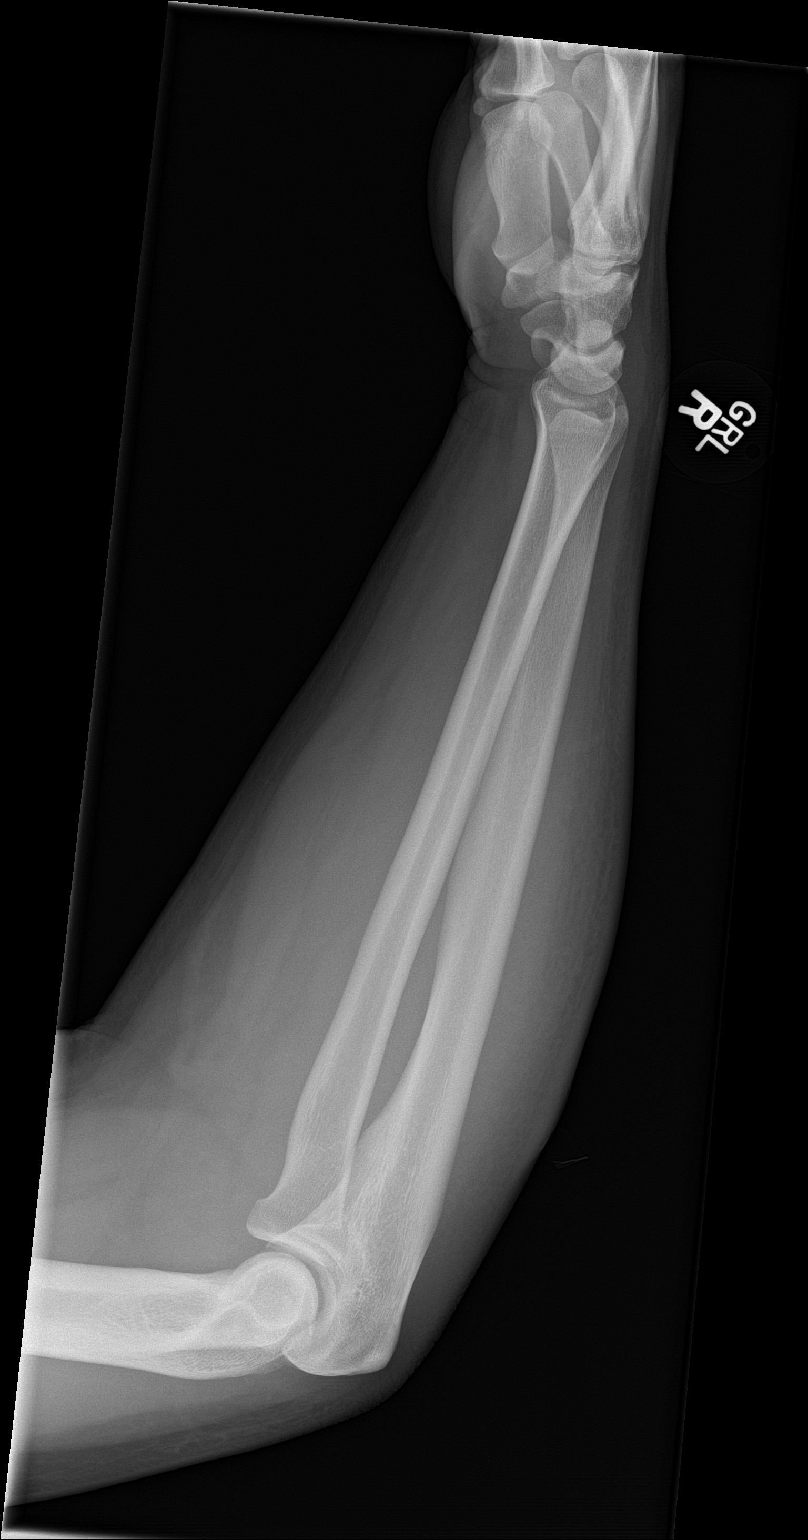

[2 of 2 positions shown; findings below may reference images not displayed]

FINDINGS: There is no evidence of fracture or dislocation. No osseous erosions
are seen. Dorsal and ulnar soft tissue swelling is noted about the
forearm. The carpal rows appear grossly intact, demonstrate normal
alignment. The elbow joint is unremarkable in appearance. No elbow
joint effusion is seen.
IMPRESSION: No evidence of fracture or dislocation. No osseous erosions seen.
Dorsal and ulnar soft tissue swelling about the forearm.

## 2016-12-23 ENCOUNTER — Encounter: Payer: Self-pay | Admitting: Emergency Medicine

## 2016-12-23 DIAGNOSIS — I10 Essential (primary) hypertension: Secondary | ICD-10-CM | POA: Insufficient documentation

## 2016-12-23 DIAGNOSIS — L02411 Cutaneous abscess of right axilla: Secondary | ICD-10-CM | POA: Insufficient documentation

## 2016-12-23 NOTE — ED Triage Notes (Signed)
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

## 2016-12-24 ENCOUNTER — Emergency Department
Admission: EM | Admit: 2016-12-24 | Discharge: 2016-12-24 | Disposition: A | Discharge status: Home / Self Care | Payer: Self-pay | Attending: Emergency Medicine | Admitting: Emergency Medicine

## 2016-12-24 DIAGNOSIS — L0291 Cutaneous abscess, unspecified: Secondary | ICD-10-CM

## 2016-12-24 MED ORDER — TRAMADOL HCL 50 MG PO TABS: 50.0000 mg | ORAL_TABLET | Freq: Four times a day (QID) | ORAL | 0 refills | Status: DC | PRN

## 2016-12-24 MED ORDER — LIDOCAINE HCL (PF) 1 % IJ SOLN
INTRAMUSCULAR | Status: AC
  Administered 2016-12-24: 10 mL
  Filled 2016-12-24: qty 10

## 2016-12-24 MED ORDER — CLINDAMYCIN HCL 150 MG PO CAPS
300.0000 mg | ORAL_CAPSULE | Freq: Once | ORAL | Status: AC
  Administered 2016-12-24: 300 mg via ORAL
  Filled 2016-12-24: qty 2

## 2016-12-24 MED ORDER — CLINDAMYCIN HCL 300 MG PO CAPS: 300.0000 mg | ORAL_CAPSULE | Freq: Three times a day (TID) | ORAL | 0 refills | Status: AC

## 2016-12-24 MED ORDER — TRAMADOL HCL 50 MG PO TABS
50.0000 mg | ORAL_TABLET | Freq: Once | ORAL | Status: AC
  Administered 2016-12-24: 50 mg via ORAL
  Filled 2016-12-24: qty 1

## 2016-12-24 MED ORDER — LIDOCAINE-PRILOCAINE 2.5-2.5 % EX CREA
TOPICAL_CREAM | CUTANEOUS | Status: AC
  Administered 2016-12-24: 1
  Filled 2016-12-24: qty 5

## 2016-12-24 NOTE — ED Provider Notes (Signed)
[facility] Emergency Department Provider Note   ____________________________________________   First MD Initiated Contact with Patient 12/24/16 0145     (approximate)  I have reviewed the triage vital signs and the nursing notes.   HISTORY  Chief Complaint Abscess    HPI Marvin Case is a 31 y.o. male who comes into the hospital today with an abscess. He reports that he noticed it in his armpit a couple of days ago. He didn't think much of it but states it got worse today. He reports that it spread going into the back of his arm and it started to drain. He reports though that it has remained painful. 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 The patient reports that he's had no fever and rates his pain at 8 out of 10 in intensity. The patient decided to come into the hospital to have his abscess drained.   Past Medical History:  Diagnosis Date  . Anxiety   . Hypertension   . Panic attacks     Patient Active Problem List   Diagnosis Date Noted  . Adjustment disorder with mixed anxiety and depressed mood 08/03/2016  . Cellulitis of arm, right 07/27/2016    Past Surgical History:  Procedure Laterality Date  . ANKLE FRACTURE SURGERY    . ANKLE SURGERY    . KNEE SURGERY    . KNEE SURGERY      Prior to Admission medications   Medication Sig Start Date End Date Taking? Authorizing Provider  cephALEXin (KEFLEX) 500 MG capsule Take 1 capsule (500 mg total) by mouth 2 (two) times daily. Patient not taking: Reported on 08/03/2016 07/26/16   Jene Everyobert Kinner, MD  clindamycin (CLEOCIN) 300 MG capsule Take 1 capsule (300 mg total) by mouth 3 (three) times daily. 12/24/16 01/03/17  Rebecka ApleyAllison P Webster, MD  hydrOXYzine (ATARAX/VISTARIL) 25 MG tablet Take 1 tablet (25 mg total) by mouth every 6 (six) hours as needed for anxiety. 08/03/16   Charm RingsJamison Y Lord, NP  mupirocin ointment (BACTROBAN) 2 % Apply to affected area 3 times  daily Patient not taking: Reported on 08/03/2016 07/27/16 07/27/17  Emily FilbertJonathan E Williams, MD  sulfamethoxazole-trimethoprim (BACTRIM DS,SEPTRA DS) 800-160 MG tablet Take 1 tablet by mouth 2 (two) times daily. Patient not taking: Reported on 08/03/2016 07/26/16   Jene Everyobert Kinner, MD  traMADol (ULTRAM) 50 MG tablet Take 1 tablet (50 mg total) by mouth every 6 (six) hours as needed. Patient not taking: Reported on 08/03/2016 07/26/16 07/26/17  Jene Everyobert Kinner, MD  traMADol (ULTRAM) 50 MG tablet Take 1 tablet (50 mg total) by mouth every 6 (six) hours as needed. 12/24/16   Rebecka ApleyAllison P Webster, MD    Allergies Patient has no known allergies.  No family history on file.  Social History Social History  Substance Use Topics  . Smoking status: Never Smoker  . Smokeless tobacco: Never Used  . Alcohol use No    Review of Systems Constitutional: No fever/chills Eyes: No visual changes. ENT: No sore throat. Cardiovascular: Denies chest pain. Respiratory: Denies shortness of breath. Gastrointestinal: No abdominal pain.  No nausea, no vomiting.  No diarrhea.  No constipation. Genitourinary: Negative for dysuria. Musculoskeletal: Negative for back pain. Skin: Axillary abscess Neurological: Negative for headaches, focal weakness or numbness.  10-point ROS otherwise negative.  ____________________________________________   PHYSICAL EXAM:  VITAL SIGNS: ED Triage Vitals [12/23/16 2249]  Enc Vitals Group     BP  132/81     Pulse Rate (!) 103     Resp 20     Temp 98 F (36.7 C)     Temp Source Oral     SpO2 95 %     Weight 240 lb (108.9 kg)     Height 5\' 7"  (1.702 m)     Head Circumference      Peak Flow      Pain Score 8     Pain Loc      Pain Edu?      Excl. in GC?     Constitutional: Alert and oriented. Well appearing and in Mild distress. Eyes: Conjunctivae are normal. PERRL. EOMI. Head: Atraumatic. Nose: No congestion/rhinnorhea. Mouth/Throat: Mucous membranes are moist.  Oropharynx  non-erythematous. Cardiovascular: Normal rate, regular rhythm. Grossly normal heart sounds.  Good peripheral circulation. Respiratory: Normal respiratory effort.  No retractions. Lungs CTAB. Gastrointestinal: Soft and nontender. No distention. Positive bowel sounds Musculoskeletal: No lower extremity tenderness nor edema.   Neurologic:  Normal speech and language.  Skin:  Skin is warm, dry and intact. Abscess noted to right axilla. There is some erythema over the area and a spot of some purulent drainage. Psychiatric: Mood and affect are normal. Speech and behavior are normal.  ____________________________________________   LABS (all labs ordered are listed, but only abnormal results are displayed)  Labs Reviewed - No data to display ____________________________________________  EKG  none ____________________________________________  RADIOLOGY  none ____________________________________________   PROCEDURES  Procedure(s) performed: please, see procedure note(s).  Marland Kitchen.Incision and Drainage Date/Time: 12/24/2016 2:55 AM Performed by: Rebecka Apley Authorized by: Rebecka Apley   Consent:    Consent obtained:  Verbal   Consent given by:  Patient Location:    Type:  Abscess   Location:  Upper extremity (right axilla) Pre-procedure details:    Skin preparation:  Chloraprep Anesthesia (see MAR for exact dosages):    Anesthesia method:  Topical application and local infiltration   Topical anesthetic:  EMLA cream   Local anesthetic:  Lidocaine 1% w/o epi Procedure type:    Complexity:  Simple Procedure details:    Incision types:  Stab incision   Scalpel blade:  11   Drainage:  Bloody   Drainage amount:  Scant   Wound treatment:  Wound left open   Packing materials:  None Post-procedure details:    Patient tolerance of procedure:  Tolerated well, no immediate complications    Critical Care performed:  No  ____________________________________________   INITIAL IMPRESSION / ASSESSMENT AND PLAN / ED COURSE  Pertinent labs & imaging results that were available during my care of the patient were reviewed by me and considered in my medical decision making (see chart for details).  I did attempt to drain the abscess and only got a small amount of bloody drainage. I think most of the abscess drained on its own. I will discharge the patient home with some clindamycin as he did have some erythema to the skin as well. The patient will be discharged to follow-up with the acute care clinic should he get any worse. The patient should return if he has any fevers or vomiting.      ____________________________________________   FINAL CLINICAL IMPRESSION(S) / ED DIAGNOSES  Final diagnoses:  Abscess      NEW MEDICATIONS STARTED DURING THIS VISIT:  New Prescriptions   CLINDAMYCIN (CLEOCIN) 300 MG CAPSULE    Take 1 capsule (300 mg total) by mouth 3 (three) times daily.  TRAMADOL (ULTRAM) 50 MG TABLET    Take 1 tablet (50 mg total) by mouth every 6 (six) hours as needed.     Note:  This document was prepared using Dragon voice recognition software and may include unintentional dictation errors.    Rebecka Apley, MD 12/24/16 [id number]

## 2017-01-21 ENCOUNTER — Emergency Department
Admission: EM | Admit: 2017-01-21 | Discharge: 2017-01-21 | Disposition: A | Discharge status: Home / Self Care | Payer: Self-pay | Attending: Emergency Medicine | Admitting: Emergency Medicine

## 2017-01-21 ENCOUNTER — Encounter: Payer: Self-pay | Admitting: *Deleted

## 2017-01-21 DIAGNOSIS — J111 Influenza due to unidentified influenza virus with other respiratory manifestations: Secondary | ICD-10-CM | POA: Insufficient documentation

## 2017-01-21 DIAGNOSIS — Z79899 Other long term (current) drug therapy: Secondary | ICD-10-CM | POA: Insufficient documentation

## 2017-01-21 DIAGNOSIS — I10 Essential (primary) hypertension: Secondary | ICD-10-CM | POA: Insufficient documentation

## 2017-01-21 MED ORDER — FLUTICASONE PROPIONATE 50 MCG/ACT NA SUSP: 2.0000 | Freq: Every day | NASAL | 0 refills | Status: AC

## 2017-01-21 MED ORDER — BENZONATATE 100 MG PO CAPS: 100.0000 mg | ORAL_CAPSULE | Freq: Three times a day (TID) | ORAL | 0 refills | Status: DC | PRN

## 2017-01-21 MED ORDER — ACETAMINOPHEN-CODEINE #3 300-30 MG PO TABS: 1.0000 | ORAL_TABLET | Freq: Three times a day (TID) | ORAL | 0 refills | Status: DC | PRN

## 2017-01-21 NOTE — ED Notes (Signed)
Pt discharged to home.  Discharge instructions reviewed.  Verbalized understanding.  No questions or concerns at this time.  Teach back verified.  Pt in NAD.  No items left in ED.   

## 2017-01-21 NOTE — ED Provider Notes (Signed)
[facility] Emergency Department Provider Note ____________________________________________  Time seen: 2333  I have reviewed the triage vital signs and the nursing notes.  HISTORY  Chief Complaint  Influenza  HPI Marvin Case is a 31 y.o. male presents to the ED for evaluation of flulike symptoms.He reports onset of symptoms yesterday. He describes subjective fevers, chills, body aches, runny nose, and cough. He has been dosing Theraflu for symptom relief. He reports a single episode of vomiting yesterday. He did not receive the seasonal flu vaccine.   Past Medical History:  Diagnosis Date  . Anxiety   . Hypertension   . Panic attacks     Patient Active Problem List   Diagnosis Date Noted  . Adjustment disorder with mixed anxiety and depressed mood 08/03/2016  . Cellulitis of arm, right 07/27/2016    Past Surgical History:  Procedure Laterality Date  . ANKLE FRACTURE SURGERY    . ANKLE SURGERY    . KNEE SURGERY    . KNEE SURGERY      Prior to Admission medications   Medication Sig Start Date End Date Taking? Authorizing Provider  acetaminophen-codeine (TYLENOL #3) 300-30 MG tablet Take 1 tablet by mouth every 8 (eight) hours as needed for moderate pain. 01/21/17   Tyr Franca V Bacon Raeleigh Guinn, PA-C  benzonatate (TESSALON PERLES) 100 MG capsule Take 1 capsule (100 mg total) by mouth 3 (three) times daily as needed for cough (Take 1-2 per dose). 01/21/17   Ricardo Schubach V Bacon Rickardo Brinegar, PA-C  cephALEXin (KEFLEX) 500 MG capsule Take 1 capsule (500 mg total) by mouth 2 (two) times daily. Patient not taking: Reported on 08/03/2016 07/26/16   Jene Everyobert Kinner, MD  fluticasone [facility](FLONASE) 50 MCG/ACT nasal spray Place 2 sprays into both nostrils daily. 01/21/17   Mattia Liford V Bacon Albie Bazin, PA-C  hydrOXYzine (ATARAX/VISTARIL) 25 MG tablet Take 1 tablet (25 mg total) by mouth every 6 (six) hours as needed for anxiety. 08/03/16   Charm RingsJamison Y Lord, NP  mupirocin ointment (BACTROBAN) 2 %  Apply to affected area 3 times daily Patient not taking: Reported on 08/03/2016 07/27/16 07/27/17  Emily FilbertJonathan E Williams, MD  sulfamethoxazole-trimethoprim (BACTRIM DS,SEPTRA DS) 800-160 MG tablet Take 1 tablet by mouth 2 (two) times daily. Patient not taking: Reported on 08/03/2016 07/26/16   Jene Everyobert Kinner, MD  traMADol (ULTRAM) 50 MG tablet Take 1 tablet (50 mg total) by mouth every 6 (six) hours as needed. Patient not taking: Reported on 08/03/2016 07/26/16 07/26/17  Jene Everyobert Kinner, MD  traMADol (ULTRAM) 50 MG tablet Take 1 tablet (50 mg total) by mouth every 6 (six) hours as needed. 12/24/16   Rebecka ApleyAllison P Webster, MD    Allergies Patient has no known allergies.  History reviewed. No pertinent family history.  Social History Social History  Substance Use Topics  . Smoking status: Never Smoker  . Smokeless tobacco: Never Used  . Alcohol use No    Review of Systems  Constitutional: Positive for subjective fever. Eyes: Negative for visual changes. ENT: Negative for sore throat. 420 notes. Cardiovascular: Negative for chest pain. Respiratory: Negative for shortness of breath. Reports nonproductive cough. Gastrointestinal: Negative for abdominal pain and diarrhea. Reports vomiting 1. Genitourinary: Negative for dysuria. Musculoskeletal: Negative for back pain. Skin: Negative for rash. Neurological: Negative for headaches, focal weakness or numbness. ____________________________________________  PHYSICAL EXAM:  VITAL SIGNS: ED Triage Vitals  Enc Vitals Group     BP 01/21/17 2247 139/80     Pulse Rate 01/21/17 2247 (!) 101  Resp 01/21/17 2247 20     Temp 01/21/17 2247 98.2 F (36.8 C)     Temp Source 01/21/17 2247 Oral     SpO2 01/21/17 2247 97 %     Weight 01/21/17 2248 240 lb (108.9 kg)     Height 01/21/17 2248 5\' 7"  (1.702 m)     Head Circumference --      Peak Flow --      Pain Score 01/21/17 2248 6     Pain Loc --      Pain Edu? --      Excl. in GC? --     Constitutional:  Alert and oriented. Well appearing and in no distress. Head: Normocephalic and atraumatic. Eyes: Conjunctivae are normal. PERRL. Normal extraocular movements Ears: Canals clear. TMs intact bilaterally. Nose: No congestion/rhinorrhea/epistaxis. Mouth/Throat: Mucous membranes are moist. Uvula is midline and tonsils are flat. No oropharyngeal lesions are appreciated. Neck: Supple. No thyromegaly. Hematological/Lymphatic/Immunological: No cervical lymphadenopathy. Cardiovascular: Normal rate, regular rhythm. Normal distal pulses. Respiratory: Normal respiratory effort. No wheezes/rales/rhonchi. Gastrointestinal: Soft and nontender. No distention. Musculoskeletal: Nontender with normal range of motion in all extremities.  Neurologic:  Normal gait without ataxia. Normal speech and language. No gross focal neurologic deficits are appreciated. Skin:  Skin is warm, dry and intact. No rash noted. Psychiatric: Mood and affect are normal. Patient exhibits appropriate insight and judgment. ____________________________________________  INITIAL IMPRESSION / ASSESSMENT AND PLAN / ED COURSE  Patient with presentation consistent with influenza. He is discharged at this time with prescriptions for Tylenol No. 3, Tessalon Perles, Flonase. He is advised to dose over-the-counter Delsym cough syrup, allergy medicines, and pseudoephedrine as needed. He should continue to hydrate to prevent dehydration. [facility] or return to the ED for worsening symptoms as discussed. ____________________________________________  FINAL CLINICAL IMPRESSION(S) / ED DIAGNOSES  Final diagnoses:  Influenza      Lissa Hoard, PA-C 01/21/17 2356    Arnaldo Natal, MD 01/23/17 1440

## 2017-01-21 NOTE — ED Triage Notes (Signed)
Pt c/o flu-like sxs, chills, generalized body aches, vomiting x 1 yesterday, runny nose.

## 2017-01-21 NOTE — Discharge Instructions (Signed)
Your symptoms are consistent with influenza. You should continue to monitor and treat fevers with Tylenol and Motrin. Continue your Theraflu. You may dose Delsym for cough, pseudoephedrine for congestion, and Benadryl for runny nose. Continue to push fluids to prevent dehydration. Follow[facility] with your provider or [facility] for continued symptoms.

## 2017-04-14 ENCOUNTER — Encounter (HOSPITAL_COMMUNITY): Payer: Self-pay | Admitting: Emergency Medicine

## 2017-04-14 DIAGNOSIS — Z5321 Procedure and treatment not carried out due to patient leaving prior to being seen by health care provider: Secondary | ICD-10-CM | POA: Insufficient documentation

## 2017-04-14 DIAGNOSIS — L02416 Cutaneous abscess of left lower limb: Secondary | ICD-10-CM | POA: Insufficient documentation

## 2017-04-14 DIAGNOSIS — L02211 Cutaneous abscess of abdominal wall: Secondary | ICD-10-CM | POA: Insufficient documentation

## 2017-04-14 DIAGNOSIS — Z79899 Other long term (current) drug therapy: Secondary | ICD-10-CM | POA: Insufficient documentation

## 2017-04-14 DIAGNOSIS — I10 Essential (primary) hypertension: Secondary | ICD-10-CM | POA: Insufficient documentation

## 2017-04-14 DIAGNOSIS — L02415 Cutaneous abscess of right lower limb: Secondary | ICD-10-CM | POA: Insufficient documentation

## 2017-04-14 LAB — CBC WITH DIFFERENTIAL/PLATELET
BASOS ABS: 0 10*3/uL (ref 0.0–0.1)
BASOS PCT: 0 %
Eosinophils Absolute: 0.2 10*3/uL (ref 0.0–0.7)
Eosinophils Relative: 1 %
HCT: 47.8 % (ref 39.0–52.0)
Hemoglobin: 16 g/dL (ref 13.0–17.0)
LYMPHS PCT: 15 %
Lymphs Abs: 1.9 10*3/uL (ref 0.7–4.0)
MCH: 29.7 pg (ref 26.0–34.0)
MCHC: 33.5 g/dL (ref 30.0–36.0)
MCV: 88.8 fL (ref 78.0–100.0)
MONO ABS: 0.9 10*3/uL (ref 0.1–1.0)
Monocytes Relative: 7 %
Neutro Abs: 9.9 10*3/uL — ABNORMAL HIGH (ref 1.7–7.7)
Neutrophils Relative %: 77 %
PLATELETS: 312 10*3/uL (ref 150–400)
RBC: 5.38 MIL/uL (ref 4.22–5.81)
RDW: 13.1 % (ref 11.5–15.5)
WBC: 12.9 10*3/uL — ABNORMAL HIGH (ref 4.0–10.5)

## 2017-04-14 LAB — BASIC METABOLIC PANEL
ANION GAP: 9 (ref 5–15)
BUN: 7 mg/dL (ref 6–20)
CALCIUM: 8.7 mg/dL — AB (ref 8.9–10.3)
CO2: 23 mmol/L (ref 22–32)
Chloride: 103 mmol/L (ref 101–111)
Creatinine, Ser: 0.84 mg/dL (ref 0.61–1.24)
GFR calc Af Amer: >60 mL/min (ref >60)
GFR calc non Af Amer: >60 mL/min (ref >60)
GLUCOSE: 117 mg/dL — AB (ref 65–99)
Potassium: 3.3 mmol/L — ABNORMAL LOW (ref 3.5–5.1)
Sodium: 135 mmol/L (ref 135–145)

## 2017-04-14 NOTE — ED Triage Notes (Signed)
Pt c/o multiple abscess around his right side abd, and both legs, prior hx of abscess and MRSA infection, denies any fever or  Chills.

## 2017-04-15 ENCOUNTER — Encounter: Payer: Self-pay | Admitting: Emergency Medicine

## 2017-04-15 ENCOUNTER — Emergency Department
Admission: EM | Admit: 2017-04-15 | Discharge: 2017-04-15 | Disposition: A | Discharge status: Home / Self Care | Payer: Self-pay | Attending: Emergency Medicine | Admitting: Emergency Medicine

## 2017-04-15 ENCOUNTER — Emergency Department (HOSPITAL_COMMUNITY)
Admission: EM | Admit: 2017-04-15 | Discharge: 2017-04-15 | Disposition: A | Discharge status: Home / Self Care | Payer: Self-pay | Attending: Dermatology | Admitting: Dermatology

## 2017-04-15 DIAGNOSIS — L03311 Cellulitis of abdominal wall: Secondary | ICD-10-CM | POA: Insufficient documentation

## 2017-04-15 DIAGNOSIS — I10 Essential (primary) hypertension: Secondary | ICD-10-CM | POA: Insufficient documentation

## 2017-04-15 DIAGNOSIS — Z79899 Other long term (current) drug therapy: Secondary | ICD-10-CM | POA: Insufficient documentation

## 2017-04-15 DIAGNOSIS — L03115 Cellulitis of right lower limb: Secondary | ICD-10-CM | POA: Insufficient documentation

## 2017-04-15 MED ORDER — CLINDAMYCIN PHOSPHATE 900 MG/6ML IJ SOLN
600.0000 mg | Freq: Once | INTRAMUSCULAR | Status: AC
  Administered 2017-04-15: 600 mg via INTRAMUSCULAR

## 2017-04-15 MED ORDER — CLINDAMYCIN PHOSPHATE 300 MG/2ML IJ SOLN
INTRAMUSCULAR | Status: AC
  Administered 2017-04-15: 600 mg via INTRAMUSCULAR
  Filled 2017-04-15: qty 2

## 2017-04-15 MED ORDER — TRAMADOL HCL 50 MG PO TABS: ORAL_TABLET | ORAL | 0 refills | Status: AC

## 2017-04-15 MED ORDER — CLINDAMYCIN HCL 150 MG PO CAPS: ORAL_CAPSULE | ORAL | 0 refills | Status: DC

## 2017-04-15 NOTE — ED Notes (Signed)
Pt did not answer when called for room x 3

## 2017-04-15 NOTE — Discharge Instructions (Signed)
Use warm compresses or tub soaks in warm water twice a day. Begin taking antibiotics as directed. Tramadol if needed for pain 1 every 4-6 hours. Return to the emergency room if any severe worsening of your symptoms, fever, inability to take antibiotic. Also call clinics listed on your discharge papers. [facility] is free of charge. The other clinics are based on your income and have sliding scale fees.

## 2017-04-15 NOTE — ED Provider Notes (Signed)
[facility] Emergency Department Provider Note ____________________________________________  Time seen: 8:47 AM  I have reviewed the triage vital signs and the nursing notes.  HISTORY  Chief Complaint  Abscess   HPI Marvin Case is a 31 y.o. male is here complaint of abscesses. Patient states that he has a history of MRSA. He states his last episode was approximately 8 months ago in which he was hospitalized. Today he is here with one area to his abdomen, lower extremity. Patient is unaware of any fever. He rates his pain as a 3/10 at this time.    Past Medical History:  Diagnosis Date  . Anxiety   . Hypertension   . Panic attacks     Patient Active Problem List   Diagnosis Date Noted  . Adjustment disorder with mixed anxiety and depressed mood 08/03/2016  . Cellulitis of arm, right 07/27/2016    Past Surgical History:  Procedure Laterality Date  . ANKLE FRACTURE SURGERY    . ANKLE SURGERY    . KNEE SURGERY    . KNEE SURGERY      Prior to Admission medications   Medication Sig Start Date End Date Taking? Authorizing Provider  clindamycin (CLEOCIN) 150 MG capsule Take 2 capsules every 6 hours 04/15/17   Bridget Hartshorn L, PA-C  fluticasone [facility]) 50 MCG/ACT nasal spray Place 2 sprays into both nostrils daily. 01/21/17   Menshew, Charlesetta Ivory, PA-C  hydrOXYzine (ATARAX/VISTARIL) 25 MG tablet Take 1 tablet (25 mg total) by mouth every 6 (six) hours as needed for anxiety. 08/03/16   Charm Rings, NP  traMADol Janean Sark) 50 MG tablet 1 tablet every 4-6 hours prn pain 04/15/17   Tommi Rumps, PA-C    Allergies Patient has no known allergies.  No family history on file.  Social History Social History  Substance Use Topics  . Smoking status: Never Smoker  . Smokeless tobacco: Never Used  . Alcohol use No    Review of Systems  Constitutional: Negative for fever. Cardiovascular: Negative for chest pain. Respiratory: Negative for  shortness of breath. Musculoskeletal: Negative for back pain. Skin: Positive for abscess. Neurological: Negative for headaches, focal weakness or numbness. ____________________________________________  PHYSICAL EXAM:  VITAL SIGNS: ED Triage Vitals  Enc Vitals Group     BP 04/15/17 0753 (!) 136/97     Pulse Rate 04/15/17 0753 (!) 107     Resp 04/15/17 0753 20     Temp 04/15/17 0753 98.6 F (37 C)     Temp Source 04/15/17 0753 Oral     SpO2 04/15/17 0753 97 %     Weight 04/15/17 0752 240 lb (108.9 kg)     Height --      Head Circumference --      Peak Flow --      Pain Score 04/15/17 0752 3     Pain Loc --      Pain Edu? --      Excl. in GC? --     Constitutional: Alert and oriented. Well appearing and in no distress. Head: Normocephalic and atraumatic. Eyes: Conjunctivae are normal.  Nose: No congestion. Neck: No stridor Cardiovascular: Normal rate, regular rhythm. Normal distal pulses. Respiratory: Normal respiratory effort. No wheezes/rales/rhonchi. Gastrointestinal: Soft and nontender.  Musculoskeletal: Nontender with normal range of motion in all extremities.  Neurologic:  Normal gait without ataxia. Normal speech and language. No gross focal neurologic deficits are appreciated. Skin:  Skin is warm, dry. There are several areas  with erythema including the right lower abdomen and right lower extremity. Areas are cellulitic without any appreciable abscess. Areas are tender and warm. No drainage is present. Psychiatric: Mood and affect are normal. Patient exhibits appropriate insight and judgment. _  INITIAL IMPRESSION / ASSESSMENT AND PLAN / ED COURSE  Patient was given clindamycin 600 mg IM in the department. He was discharged with a prescription for clindamycin to continue. Prescription for clindamycin 150 mg 2 capsules 4 times a day. He is also given a prescription for tramadol 50 mg 1 every 4-6 hours as needed for pain. Warm compresses to the areas for a tub bath. He  is return to the emergency room if any severe worsening of his symptoms. He was also given a list of clinics in the area to follow-up with including [facility], [facility], [facility], and [facility].    ____________________________________________  FINAL CLINICAL IMPRESSION(S) / ED DIAGNOSES  Final diagnoses:  Cellulitis of abdominal wall  Cellulitis of right lower extremity     Tommi RumpsSummers, Rhonda L, PA-C 04/15/17 1117    Merrily Brittleifenbark, Neil, MD 04/15/17 1253

## 2017-04-15 NOTE — ED Triage Notes (Signed)
Pt with abscess to abd area.

## 2017-04-15 NOTE — ED Notes (Signed)
NAD noted at time of D/C. Pt denies questions or concerns. Pt ambulatory to the lobby at this time.  

## 2018-04-27 ENCOUNTER — Other Ambulatory Visit: Payer: Self-pay

## 2018-04-27 ENCOUNTER — Encounter (HOSPITAL_COMMUNITY): Payer: Self-pay | Admitting: Emergency Medicine

## 2018-04-27 ENCOUNTER — Emergency Department (HOSPITAL_COMMUNITY)
Admission: EM | Admit: 2018-04-27 | Discharge: 2018-04-27 | Disposition: A | Discharge status: Home / Self Care | Payer: Self-pay | Attending: Emergency Medicine | Admitting: Emergency Medicine

## 2018-04-27 DIAGNOSIS — Z79899 Other long term (current) drug therapy: Secondary | ICD-10-CM | POA: Insufficient documentation

## 2018-04-27 DIAGNOSIS — I10 Essential (primary) hypertension: Secondary | ICD-10-CM | POA: Insufficient documentation

## 2018-04-27 DIAGNOSIS — L03114 Cellulitis of left upper limb: Secondary | ICD-10-CM | POA: Insufficient documentation

## 2018-04-27 DIAGNOSIS — L0291 Cutaneous abscess, unspecified: Secondary | ICD-10-CM

## 2018-04-27 MED ORDER — CEPHALEXIN 500 MG PO CAPS: 500.0000 mg | ORAL_CAPSULE | Freq: Four times a day (QID) | ORAL | 0 refills | Status: DC

## 2018-04-27 MED ORDER — SULFAMETHOXAZOLE-TRIMETHOPRIM 800-160 MG PO TABS: 1.0000 | ORAL_TABLET | Freq: Two times a day (BID) | ORAL | 0 refills | Status: AC

## 2018-04-27 NOTE — ED Triage Notes (Signed)
Pt reports abscess to left arm for the last 3 days. Pt with hx of same. Denies recent fever. VSS.

## 2018-04-27 NOTE — ED Notes (Signed)
PA in with patient at this time.

## 2018-04-27 NOTE — Discharge Instructions (Signed)
Medications: Bactrim, Keflex  Treatment: Take Bactrim and Keflex until completed.  Use warm compresses daily to help continue drainage.  Follow-up: Please return to the emergency department if you develop any increasing pain, redness spreading past the circle drawn, red streaking from the area, or fever.  Please establish care with a primary care provider by calling one of the offices below for further treatment of your high blood pressure and recurrent abscess.

## 2018-04-28 NOTE — ED Provider Notes (Signed)
[facility] EMERGENCY DEPARTMENT Provider Note   CSN: [id number] Arrival date & time: 04/27/18  4098     History   Chief Complaint Chief Complaint  Patient presents with  . Abscess    HPI Marvin Case is a 32 y.o. male with history of hypertension, anxiety, panic attacks, recurrent MRSA infections who presents with a one-week history of redness and pain to left upper arm.  Patient denies any significant drainage to the area.  He denies any injury to the area.  He denies any fevers or other symptoms.  He has not tried anything at home for his symptoms.  HPI  Past Medical History:  Diagnosis Date  . Anxiety   . Hypertension   . Panic attacks     Patient Active Problem List   Diagnosis Date Noted  . Adjustment disorder with mixed anxiety and depressed mood 08/03/2016  . Cellulitis of arm, right 07/27/2016    Past Surgical History:  Procedure Laterality Date  . ANKLE FRACTURE SURGERY    . ANKLE SURGERY    . KNEE SURGERY    . KNEE SURGERY          Home Medications    Prior to Admission medications   Medication Sig Start Date End Date Taking? Authorizing Provider  cephALEXin (KEFLEX) 500 MG capsule Take 1 capsule (500 mg total) by mouth 4 (four) times daily. 04/27/18   Carrera Kiesel, Waylan Boga, PA-C  clindamycin (CLEOCIN) 150 MG capsule Take 2 capsules every 6 hours 04/15/17   Bridget Hartshorn L, PA-C  fluticasone [facility]) 50 MCG/ACT nasal spray Place 2 sprays into both nostrils daily. 01/21/17   Menshew, Charlesetta Ivory, PA-C  hydrOXYzine (ATARAX/VISTARIL) 25 MG tablet Take 1 tablet (25 mg total) by mouth every 6 (six) hours as needed for anxiety. 08/03/16   Charm Rings, NP  sulfamethoxazole-trimethoprim (BACTRIM DS,SEPTRA DS) 800-160 MG tablet Take 1 tablet by mouth 2 (two) times daily for 7 days. 04/27/18 05/04/18  Emi Holes, PA-C  traMADol (ULTRAM) 50 MG tablet 1 tablet every 4-6 hours prn pain 04/15/17   Tommi Rumps, PA-C    Family  History History reviewed. No pertinent family history.  Social History Social History   Tobacco Use  . Smoking status: Never Smoker  . Smokeless tobacco: Never Used  Substance Use Topics  . Alcohol use: Yes    Comment: seldom  . Drug use: No     Allergies   Patient has no known allergies.   Review of Systems Review of Systems  Constitutional: Negative for fever.  Skin: Positive for wound.     Physical Exam Updated Vital Signs BP 132/85 (BP Location: Right Arm)   Pulse 87   Temp 98.7 F (37.1 C) (Oral)   Resp 20   Ht 5\' 7"  (1.702 m)   Wt 113.4 kg (250 lb)   SpO2 98%   BMI 39.16 kg/m   Physical Exam  Constitutional: He appears well-developed and well-nourished. No distress.  HENT:  Head: Normocephalic and atraumatic.  Mouth/Throat: Oropharynx is clear and moist. No oropharyngeal exudate.  Eyes: Pupils are equal, round, and reactive to light. Conjunctivae are normal. Right eye exhibits no discharge. Left eye exhibits no discharge. No scleral icterus.  Neck: Normal range of motion. Neck supple. No thyromegaly present.  Cardiovascular: Normal rate, regular rhythm, normal heart sounds and intact distal pulses. Exam reveals no gallop and no friction rub.  No murmur heard. Pulmonary/Chest: Effort normal and breath sounds normal.  No stridor. No respiratory distress. He has no wheezes. He has no rales.  Musculoskeletal: He exhibits no edema.  Lymphadenopathy:    He has no cervical adenopathy.  Neurological: He is alert. Coordination normal.  Skin: Skin is warm and dry. No rash noted. He is not diaphoretic. No pallor.  2-1/2 cm area of circumferential erythema and tenderness with central scabbed area to left upper arm, some dried drainage noted, 2 small pustules around the central scabbed area Few other small scabbed areas on the arm as well, no fluctuance or pustules noted  Psychiatric: He has a normal mood and affect.  Nursing note and vitals reviewed.    ED  Treatments / Results  Labs (all labs ordered are listed, but only abnormal results are displayed) Labs Reviewed - No data to display  EKG None  Radiology No results found.  Procedures  EMERGENCY DEPARTMENT US SOFT TISSUE INTERPRETATION "Study: Limited Soft Tissue Ultrasound"  INDICATIONS: Soft tissue infection Multiple views of the body part were obtained in real-time with a multi-frequency linear probe  PERFORMED BY: Myself IMAGES ARCHIVED?: No SIDE:Left BODY PART:Upper extremity INTERPRETATION:  No abcess noted and Cellulitis present     .Marland KitchenIncision and Drainage Date/Time: 04/28/2018 9:14 AM Performed by: Emi Holes, PA-C Authorized by: Emi Holes, PA-C   Consent:    Consent obtained:  Verbal   Consent given by:  Patient   Risks discussed:  Incomplete drainage   Alternatives discussed:  No treatment Location:    Type:  Abscess   Size:  2mm   Location:  Upper extremity   Upper extremity location:  Arm   Arm location:  L upper arm Pre-procedure details:    Skin preparation:  Chloraprep Anesthesia (see MAR for exact dosages):    Anesthesia method:  None Procedure type:    Complexity:  Simple Procedure details:    Needle aspiration: no     Incision types:  Stab incision   Incision and drainage depth: epidermal.   Scalpel blade:  11   Drainage:  Purulent   Drainage amount:  Scant   Wound treatment:  Wound left open   Packing materials:  None Post-procedure details:    Patient tolerance of procedure:  Tolerated well, no immediate complications   (including critical care time)  Medications Ordered in ED Medications - No data to display   Initial Impression / Assessment and Plan / ED Course  I have reviewed the triage vital signs and the nursing notes.  Pertinent labs & imaging results that were available during my care of the patient were reviewed by me and considered in my medical decision making (see chart for details).     Patient with  suspected MRSA infection.  No large fluid collection seen on ultrasound, but pustules drained with a single stab incision with scalpel.  Will treat with Bactrim and Keflex.  Area of cellulitis demarcated with skin marker.  Patient advised to return in 2 days for wound check.  Return precautions discussed for reasons to return sooner.  Patient understands and agrees with plan.  Patient vitals stable throughout ED course and discharged in satisfactory condition peer  Final Clinical Impressions(s) / ED Diagnoses   Final diagnoses:  Cellulitis of left upper extremity  Abscess    ED Discharge Orders        Ordered    sulfamethoxazole-trimethoprim (BACTRIM DS,SEPTRA DS) 800-160 MG tablet  2 times daily     04/27/18 1157    cephALEXin (KEFLEX) 500 MG  capsule  4 times daily     04/27/18 [id number]       Drenda Sobecki M, PA-C 04/28/18 [id number]    Gerhard MunchLockwood, Robert, MD 05/01/18 2326

## 2019-04-21 ENCOUNTER — Other Ambulatory Visit: Payer: Self-pay

## 2019-04-21 ENCOUNTER — Ambulatory Visit
Admission: EM | Admit: 2019-04-21 | Discharge: 2019-04-21 | Disposition: A | Discharge status: Home / Self Care | Payer: 59 | Attending: Family Medicine | Admitting: Family Medicine

## 2019-04-21 DIAGNOSIS — W278XXA Contact with other nonpowered hand tool, initial encounter: Secondary | ICD-10-CM | POA: Diagnosis not present

## 2019-04-21 DIAGNOSIS — Z23 Encounter for immunization: Secondary | ICD-10-CM

## 2019-04-21 DIAGNOSIS — S81811A Laceration without foreign body, right lower leg, initial encounter: Secondary | ICD-10-CM | POA: Diagnosis not present

## 2019-04-21 MED ORDER — TETANUS-DIPHTH-ACELL PERTUSSIS 5-2.5-18.5 LF-MCG/0.5 IM SUSP
0.5000 mL | Freq: Once | INTRAMUSCULAR | Status: AC
  Administered 2019-04-21: 0.5 mL via INTRAMUSCULAR

## 2019-04-21 NOTE — Discharge Instructions (Signed)
3 stitches were placed in the leg Patient be able to be removed in 10 days. Watch for signs of infection include redness, swelling or drainage. Tetanus shot was updated here in clinic today Follow up as needed for continued or worsening symptoms

## 2019-04-21 NOTE — ED Provider Notes (Addendum)
[facility]    CSN: [id number] Arrival date & time: 04/21/19  1457     History   Chief Complaint Chief Complaint  Patient presents with  . Laceration    HPI Marvin Case is a 33 y.o. male.   Patient is a 33 year old male presents with laceration to the right lateral knee. This occurred earlier today when he was opening a box with a crow bar and it slipped. The tool was new. He is unsure of tetanus. Bleeding controlled. Sensation intact. Good ROM.   ROS per HPI      Past Medical History:  Diagnosis Date  . Anxiety   . Hypertension   . Panic attacks     Patient Active Problem List   Diagnosis Date Noted  . Adjustment disorder with mixed anxiety and depressed mood 08/03/2016  . Cellulitis of arm, right 07/27/2016    Past Surgical History:  Procedure Laterality Date  . ANKLE FRACTURE SURGERY    . ANKLE SURGERY    . KNEE SURGERY    . KNEE SURGERY         Home Medications    Prior to Admission medications   Medication Sig Start Date End Date Taking? Authorizing Provider  fluticasone (FLONASE) 50 MCG/ACT nasal spray Place 2 sprays into both nostrils daily. 01/21/17   Menshew, Charlesetta IvoryJenise V Bacon, PA-C  hydrOXYzine (ATARAX/VISTARIL) 25 MG tablet Take 1 tablet (25 mg total) by mouth every 6 (six) hours as needed for anxiety. 08/03/16   Charm RingsLord, Jamison Y, NP  traMADol Janean Sark(ULTRAM) 50 MG tablet 1 tablet every 4-6 hours prn pain 04/15/17   Tommi RumpsSummers, Rhonda L, PA-C    Family History No family history on file.  Social History Social History   Tobacco Use  . Smoking status: Never Smoker  . Smokeless tobacco: Never Used  Substance Use Topics  . Alcohol use: Yes    Comment: seldom  . Drug use: No     Allergies   Patient has no known allergies.   Review of Systems Review of Systems   Physical Exam Triage Vital Signs ED Triage Vitals  Enc Vitals Group     BP 04/21/19 1505 (!) 145/88     Pulse Rate 04/21/19 1505 (!) 115     Resp 04/21/19 1505 16      Temp 04/21/19 1505 99 F (37.2 C)     Temp Source 04/21/19 1505 Oral     SpO2 04/21/19 1505 97 %     Weight --      Height --      Head Circumference --      Peak Flow --      Pain Score 04/21/19 1519 2     Pain Loc --      Pain Edu? --      Excl. in GC? --    No data found.  Updated Vital Signs BP (!) 145/88 (BP Location: Left Arm)   Pulse (!) 115   Temp 99 F (37.2 C) (Oral)   Resp 16   SpO2 97%   Visual Acuity Right Eye Distance:   Left Eye Distance:   Bilateral Distance:    Right Eye Near:   Left Eye Near:    Bilateral Near:     Physical Exam Vitals signs and nursing note reviewed.  Constitutional:      General: He is not in acute distress.    Appearance: Normal appearance. He is not ill-appearing, toxic-appearing or diaphoretic.  HENT:  Head: Normocephalic and atraumatic.     Nose: Nose normal.  Eyes:     Conjunctiva/sclera: Conjunctivae normal.  Neck:     Musculoskeletal: Normal range of motion.  Pulmonary:     Effort: Pulmonary effort is normal.  Musculoskeletal: Normal range of motion.     Right knee: He exhibits laceration.     Comments: Well Approximated 2 cm laceration to the right lateral knee.     Skin:    General: Skin is warm and dry.  Neurological:     Mental Status: He is alert.  Psychiatric:        Mood and Affect: Mood normal.      UC Treatments / Results  Labs (all labs ordered are listed, but only abnormal results are displayed) Labs Reviewed - No data to display  EKG None  Radiology No results found.  Procedures Laceration Repair Date/Time: 04/21/2019 4:22 PM Performed by: Janace Aris, NP Authorized by: Janace Aris, NP   Consent:    Consent obtained:  Verbal   Consent given by:  Patient   Risks discussed:  Infection, need for additional repair, pain, poor cosmetic result and poor wound healing   Alternatives discussed:  No treatment and delayed treatment Universal protocol:    Patient identity  confirmed:  Verbally with patient Anesthesia (see MAR for exact dosages):    Anesthesia method:  Local infiltration   Local anesthetic:  Lidocaine 1% w/o epi Laceration details:    Location:  Leg   Leg location:  R upper leg   Length (cm):  2 Repair type:    Repair type:  Simple Pre-procedure details:    Preparation:  Patient was prepped and draped in usual sterile fashion Exploration:    Wound exploration: wound explored through full range of motion     Contaminated: no   Treatment:    Area cleansed with:  Soap and water   Amount of cleaning:  Standard   Irrigation method:  Syringe   Visualized foreign bodies/material removed: no   Skin repair:    Repair method:  Sutures   Suture size:  4-0   Suture material:  Nylon   Suture technique:  Simple interrupted   Number of sutures:  3 Approximation:    Approximation:  Loose Post-procedure details:    Dressing:  Open (no dressing)   Patient tolerance of procedure:  Tolerated well, no immediate complications   (including critical care time)  Medications Ordered in UC Medications  Tdap (BOOSTRIX) injection 0.5 mL (0.5 mLs Intramuscular Given 04/21/19 1542)    Initial Impression / Assessment and Plan / UC Course  I have reviewed the triage vital signs and the nursing notes.  Pertinent labs & imaging results that were available during my care of the patient were reviewed by me and considered in my medical decision making (see chart for details).     Leg laceration.  Sutured laceration to the right lateral knee.  3 stiches.  Pt tolerated well.  Tetanus updated.  Instructed to watch for signs of infection.   Final Clinical Impressions(s) / UC Diagnoses   Final diagnoses:  Leg laceration, right, initial encounter     Discharge Instructions     3 stitches were placed in the leg Patient be able to be removed in 10 days. Watch for signs of infection include redness, swelling or drainage. Tetanus shot was updated here  in clinic today Follow up as needed for continued or worsening symptoms  ED Prescriptions    None     Controlled Substance Prescriptions [place] Controlled Substance Registry consulted? Not Applicable   Janace Aris, NP 04/21/19 1619    Janace Aris, NP 04/21/19 1625

## 2019-04-21 NOTE — ED Triage Notes (Signed)
Pt states cut his LRE with a prior bar, bleeding controlled

## 2019-08-27 ENCOUNTER — Other Ambulatory Visit: Payer: Self-pay

## 2019-08-27 ENCOUNTER — Encounter (HOSPITAL_COMMUNITY): Payer: Self-pay | Admitting: Emergency Medicine

## 2019-08-27 ENCOUNTER — Emergency Department (HOSPITAL_COMMUNITY)
Admission: EM | Admit: 2019-08-27 | Discharge: 2019-08-27 | Disposition: A | Discharge status: Home / Self Care | Payer: 59 | Attending: Emergency Medicine | Admitting: Emergency Medicine

## 2019-08-27 DIAGNOSIS — H60502 Unspecified acute noninfective otitis externa, left ear: Secondary | ICD-10-CM | POA: Diagnosis not present

## 2019-08-27 DIAGNOSIS — H9202 Otalgia, left ear: Secondary | ICD-10-CM | POA: Diagnosis present

## 2019-08-27 DIAGNOSIS — I10 Essential (primary) hypertension: Secondary | ICD-10-CM | POA: Diagnosis not present

## 2019-08-27 MED ORDER — HYDROCODONE-ACETAMINOPHEN 5-325 MG PO TABS
1.0000 | ORAL_TABLET | Freq: Once | ORAL | Status: AC
  Administered 2019-08-27: 10:00:00 1 via ORAL
  Filled 2019-08-27: qty 1

## 2019-08-27 MED ORDER — CIPROFLOXACIN-DEXAMETHASONE 0.3-0.1 % OT SUSP
4.0000 [drp] | Freq: Once | OTIC | Status: AC
  Administered 2019-08-27: 10:00:00 4 [drp] via OTIC
  Filled 2019-08-27: qty 7.5

## 2019-08-27 MED ORDER — AMOXICILLIN-POT CLAVULANATE 875-125 MG PO TABS: 1.0000 | ORAL_TABLET | Freq: Two times a day (BID) | ORAL | 0 refills | Status: DC

## 2019-08-27 NOTE — ED Provider Notes (Signed)
[facility]   CSN: [id number] Arrival date & time: 08/27/19  0440     History   Chief Complaint Chief Complaint  Patient presents with  . Otalgia    HPI Theodosia QuayRichard E Rowlette is a 33 y.o. male with a past medical history significant for hypertension and anxiety who presents to the ED on 10/2 due to severe, 10/10, worsening left ear pain that started on Wednesday morning. Patient was seen by his PCP on Wednesday and was given 1000mg  of Amoxicillin every 8 hours. Patient has been taking his antibiotics consistently, but pain has significantly worsened. Pain radiates to left jaw line and is associated with hearing loss and headache. Patient denies trauma to ear. No history of flying, swimming, or loud music. Patient does not have a history of diabetes. Patient denies fever, chills, chest pain, and shortness of breath.  Past Medical History:  Diagnosis Date  . Anxiety   . Hypertension   . Panic attacks     Patient Active Problem List   Diagnosis Date Noted  . Adjustment disorder with mixed anxiety and depressed mood 08/03/2016  . Cellulitis of arm, right 07/27/2016    Past Surgical History:  Procedure Laterality Date  . ANKLE FRACTURE SURGERY    . ANKLE SURGERY    . KNEE SURGERY    . KNEE SURGERY          Home Medications    Prior to Admission medications   Medication Sig Start Date End Date Taking? Authorizing Provider  amoxicillin-clavulanate (AUGMENTIN) 875-125 MG tablet Take 1 tablet by mouth every 12 (twelve) hours. 08/27/19   Cheek, Rayfield Citizenaroline B, PA-C  fluticasone (FLONASE) 50 MCG/ACT nasal spray Place 2 sprays into both nostrils daily. 01/21/17   Menshew, Charlesetta IvoryJenise V Bacon, PA-C  hydrOXYzine (ATARAX/VISTARIL) 25 MG tablet Take 1 tablet (25 mg total) by mouth every 6 (six) hours as needed for anxiety. 08/03/16   Charm RingsLord, Jamison Y, NP  traMADol Janean Sark(ULTRAM) 50 MG tablet 1 tablet every 4-6 hours prn pain 04/15/17   Tommi RumpsSummers, Rhonda L,  PA-C    Family History No family history on file.  Social History Social History   Tobacco Use  . Smoking status: Never Smoker  . Smokeless tobacco: Never Used  Substance Use Topics  . Alcohol use: Yes    Comment: seldom  . Drug use: No     Allergies   Patient has no known allergies.   Review of Systems Review of Systems  Constitutional: Negative for chills and fever.  HENT: Positive for congestion, ear discharge, ear pain, hearing loss, sinus pressure and sore throat. Negative for dental problem, facial swelling, rhinorrhea and tinnitus.   Eyes: Negative for visual disturbance.  Respiratory: Negative for shortness of breath.   Cardiovascular: Negative for chest pain and palpitations.  Gastrointestinal: Negative for abdominal pain.  Skin: Negative for color change and rash.  Neurological: Positive for headaches.  All other systems reviewed and are negative.    Physical Exam Updated Vital Signs BP (!) 143/95 (BP Location: Right Arm)   Pulse 89   Temp 99 F (37.2 C) (Oral)   Resp 15   Ht 5\' 8"  (1.727 m)   Wt 113.4 kg   SpO2 98%   BMI 38.01 kg/m   Physical Exam Constitutional:      General: He is not in acute distress.    Appearance: He is not ill-appearing.  HENT:     Head: Normocephalic.  Right Ear: Tympanic membrane and ear canal normal.     Ears:     Comments: Left pinna tenderness to palpation. Left ear canal significantly edematous and erythematous. Unable to visualize full left TM.     Nose: Congestion present.     Comments: Edematous turbinates bilaterally    Mouth/Throat:     Mouth: Mucous membranes are moist.     Pharynx: No oropharyngeal exudate or posterior oropharyngeal erythema.  Eyes:     Conjunctiva/sclera: Conjunctivae normal.     Pupils: Pupils are equal, round, and reactive to light.  Neck:     Musculoskeletal: Normal range of motion and neck supple. No neck rigidity.  Cardiovascular:     Rate and Rhythm: Normal rate and regular  rhythm.  Pulmonary:     Effort: Pulmonary effort is normal. No respiratory distress.     Breath sounds: Normal breath sounds. No wheezing.  Abdominal:     General: Abdomen is flat. There is no distension.     Palpations: Abdomen is soft.     Tenderness: There is no abdominal tenderness.  Musculoskeletal: Normal range of motion.  Lymphadenopathy:     Cervical: Cervical adenopathy (left sided posterior cervical LAD) present.  Neurological:     General: No focal deficit present.     Mental Status: He is alert.      ED Treatments / Results  Labs (all labs ordered are listed, but only abnormal results are displayed) Labs Reviewed - No data to display  EKG None  Radiology No results found.  Procedures Procedures (including critical care time)  Medications Ordered in ED Medications  HYDROcodone-acetaminophen (NORCO/VICODIN) 5-325 MG per tablet 1 tablet (1 tablet Oral Given 08/27/19 0956)  ciprofloxacin-dexamethasone (CIPRODEX) 0.3-0.1 % OTIC (EAR) suspension 4 drop (4 drops Left EAR Given 08/27/19 1017)     Initial Impression / Assessment and Plan / ED Course  I have reviewed the triage vital signs and the nursing notes.  Pertinent labs & imaging results that were available during my care of the patient were reviewed by me and considered in my medical decision making (see chart for details).  Mia Winthrop is a 33 year old male who presents to the ED due to worsening left ear pain. Patient was given amoxicillin at PCP on Wednesday, but pain has progressively gotten worse. Physical exam consistent with otitis externa. Unable to fully visualize TM during physical exam due to left ear canal edema, so cannot rule out otitis media. Visible clear drainage in left ear canal. Tenderness to palpation behind and below ear, but not directly over mastoid bone. Suspect complicated otitis externa, but some concern for more complicated infection.  I have discussed the concern for mastoiditis  and deep space infection with patient and the need for a CT scan; however, patient does not want the scan. He wants to try an ear wick and follow-up with ENT on Monday. Patient was educated on risks of mastoiditis and other more complicated infections that cannot be picked up without CT scan. Patient still states he does not want CT scan and states his understanding of the risks associated with no scan.   Ear wick was placed in left ear with 4 drops of Ciprodex. Since patient was already on Amoxicillin, medication switched to Augmentin. Patient also given Ciprodex drops to use at home. Patient discharged home with strict ED return precautions and close follow-up with ENT on Monday. Patient agrees with plan and states understanding. Patient's blood pressure was mildly elevated during  today's visit. Patient instructed to get blood pressure rechecked at PCP next week. Patient's pain is controlled in hospital and will be discharged home with close ENT follow-up.  Final Clinical Impressions(s) / ED Diagnoses   Final diagnoses:  Acute otitis externa of left ear, unspecified type    ED Discharge Orders         Ordered    amoxicillin-clavulanate (AUGMENTIN) 875-125 MG tablet  Every 12 hours     08/27/19 0949           Jonette Eva, PA-C 08/27/19 Estelle, Royal, DO 08/27/19 1510

## 2019-08-27 NOTE — Discharge Instructions (Signed)
-  Please follow-up with ENT on Monday to check ear and remove ear wick. Number has been provided in the discharge paper. I recommend calling today to schedule an appointment -I have switched your antibiotics from amoxicillin to Augmentin. Stop amoxicillin and start Augmentin twice daily for 7 days. -Please return to the ED if you develop a fever or severe pain over the bone behind your ear. -You have been given ear drops. You may use 4 drops in left ear 3-4 times daily.

## 2019-08-27 NOTE — ED Triage Notes (Signed)
Pt reports two days of left ear pain.  Pt went to PCP who prescribed antibiotics, no relief after two days of medication.  PT reports the pain woke him up this morning.  No fever at this time

## 2021-04-16 ENCOUNTER — Emergency Department (HOSPITAL_COMMUNITY)
Admission: EM | Admit: 2021-04-16 | Discharge: 2021-04-16 | Disposition: A | Discharge status: Home / Self Care | Payer: BC Managed Care – PPO | Attending: Emergency Medicine | Admitting: Emergency Medicine

## 2021-04-16 ENCOUNTER — Encounter (HOSPITAL_COMMUNITY): Payer: Self-pay

## 2021-04-16 ENCOUNTER — Other Ambulatory Visit: Payer: Self-pay

## 2021-04-16 DIAGNOSIS — Z20822 Contact with and (suspected) exposure to covid-19: Secondary | ICD-10-CM | POA: Insufficient documentation

## 2021-04-16 DIAGNOSIS — R32 Unspecified urinary incontinence: Secondary | ICD-10-CM | POA: Diagnosis not present

## 2021-04-16 DIAGNOSIS — R509 Fever, unspecified: Secondary | ICD-10-CM | POA: Diagnosis present

## 2021-04-16 DIAGNOSIS — I10 Essential (primary) hypertension: Secondary | ICD-10-CM | POA: Insufficient documentation

## 2021-04-16 DIAGNOSIS — R519 Headache, unspecified: Secondary | ICD-10-CM | POA: Insufficient documentation

## 2021-04-16 LAB — URINALYSIS, ROUTINE W REFLEX MICROSCOPIC
Bilirubin Urine: NEGATIVE
Glucose, UA: NEGATIVE mg/dL
Ketones, ur: NEGATIVE mg/dL
Nitrite: NEGATIVE
Protein, ur: NEGATIVE mg/dL
Specific Gravity, Urine: 1.001 — ABNORMAL LOW (ref 1.005–1.030)
pH: 6 (ref 5.0–8.0)

## 2021-04-16 LAB — RESP PANEL BY RT-PCR (FLU A&B, COVID) ARPGX2
Influenza A by PCR: NEGATIVE
Influenza B by PCR: NEGATIVE
SARS Coronavirus 2 by RT PCR: NEGATIVE

## 2021-04-16 LAB — CK: Total CK: 179 U/L (ref 49–397)

## 2021-04-16 MED ORDER — SODIUM CHLORIDE 0.9 % IV BOLUS
1000.0000 mL | Freq: Once | INTRAVENOUS | Status: AC
  Administered 2021-04-16: 1000 mL via INTRAVENOUS

## 2021-04-16 MED ORDER — KETOROLAC TROMETHAMINE 15 MG/ML IJ SOLN
15.0000 mg | Freq: Once | INTRAMUSCULAR | Status: AC
  Administered 2021-04-16: 15 mg via INTRAVENOUS
  Filled 2021-04-16: qty 1

## 2021-04-16 NOTE — ED Triage Notes (Signed)
Pt presents from home, c/o HA, fever and body aches starting yesterday. States temp was 103.6 at home. Last tylenol @ 11pm. Pt also c/o urinary urgency

## 2021-04-16 NOTE — ED Provider Notes (Addendum)
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  He was working in a hot environment yesterday and was sweating profusely.  He developed a fever which was as high as 103.6.  This was associated with generalized muscle cramping and headache.  He rates his headache is moderate to severe.  He initially had difficulty urinating despite drinking what he thought was adequate fluid.  He went home and began drinking a lot of Gatorade and other liquids and subsequently started urinating frequently and in fact had 1 episode of urinary incontinence.  His mother states she has not noticed any mental status changes.  He took Tylenol yesterday evening about 11 PM and he is no longer febrile.  He is not having any URI symptoms with this.  He had a vasectomy about a week and a half ago but denies any pain, redness or drainage of the scrotum.   Past Medical History:  Diagnosis Date  . Anxiety   . Hypertension   . Panic attacks     Past Surgical History:  Procedure Laterality Date  . ANKLE FRACTURE SURGERY    . ANKLE SURGERY    . KNEE SURGERY    . KNEE SURGERY      History reviewed. No pertinent family history.  Social History   Tobacco Use  . Smoking status: Never Smoker  . Smokeless tobacco: Never Used  Substance Use Topics  . Alcohol use: Yes    Comment: seldom  . Drug use: No    Prior to Admission medications   Medication Sig Start Date End Date Taking? Authorizing Provider  amoxicillin-clavulanate (AUGMENTIN) 875-125 MG tablet Take 1 tablet by mouth every 12 (twelve) hours. 08/27/19   Mannie Stabile, PA-C  fluticasone (FLONASE) 50 MCG/ACT nasal spray Place 2 sprays into both nostrils daily. 01/21/17   Menshew, Charlesetta Ivory, PA-C  hydrOXYzine  (ATARAX/VISTARIL) 25 MG tablet Take 1 tablet (25 mg total) by mouth every 6 (six) hours as needed for anxiety. 08/03/16   Charm Rings, NP  traMADol Janean Sark) 50 MG tablet 1 tablet every 4-6 hours prn pain 04/15/17   Tommi Rumps, PA-C    Allergies Patient has no known allergies.   REVIEW OF SYSTEMS  Negative except as noted here or in the History of Present Illness.   PHYSICAL EXAMINATION  Initial Vital Signs Blood pressure 116/66, pulse (!) 107, temperature 99.1 F (37.3 C), temperature source Oral, resp. rate 19, height 5\' 8"  (1.727 m), weight 113.4 kg, SpO2 99 %.  Examination General: Well-developed, well-nourished male in no acute distress; appearance consistent with age of record HENT: normocephalic; atraumatic Eyes: pupils equal, round and reactive to light; extraocular muscles intact Neck: supple Heart: regular rate and rhythm Lungs: clear to auscultation bilaterally Abdomen: soft; nondistended; nontender; bowel sounds present Extremities: No deformity; full range of motion; pulses normal Neurologic: Awake, alert and oriented; motor function intact in all extremities and symmetric; no facial droop Skin: Warm and dry Psychiatric: Normal mood and affect   RESULTS  Summary of this visit's results, reviewed and interpreted by myself:   EKG Interpretation  Date/Time:    Ventricular Rate:    PR Interval:    QRS Duration:   QT Interval:    QTC Calculation:  R Axis:     Text Interpretation:        Laboratory Studies: Results for orders placed or performed during the hospital encounter of 04/16/21 (from the past 24 hour(s))  Resp Panel by RT-PCR (Flu A&B, Covid) Nasopharyngeal Swab     Status: None   Collection Time: 04/16/21  2:34 AM   Specimen: Nasopharyngeal Swab; Nasopharyngeal(NP) swabs in vial transport medium  Result Value Ref Range   SARS Coronavirus 2 by RT PCR NEGATIVE NEGATIVE   Influenza A by PCR NEGATIVE NEGATIVE   Influenza B by PCR NEGATIVE  NEGATIVE  Urinalysis, Routine w reflex microscopic Urine, Clean Catch     Status: Abnormal   Collection Time: 04/16/21  2:40 AM  Result Value Ref Range   Color, Urine STRAW (A) YELLOW   APPearance CLEAR CLEAR   Specific Gravity, Urine 1.001 (L) 1.005 - 1.030   pH 6.0 5.0 - 8.0   Glucose, UA NEGATIVE NEGATIVE mg/dL   Hgb urine dipstick SMALL (A) NEGATIVE   Bilirubin Urine NEGATIVE NEGATIVE   Ketones, ur NEGATIVE NEGATIVE mg/dL   Protein, ur NEGATIVE NEGATIVE mg/dL   Nitrite NEGATIVE NEGATIVE   Leukocytes,Ua MODERATE (A) NEGATIVE   WBC, UA 0-5 0 - 5 WBC/hpf   Bacteria, UA RARE (A) NONE SEEN   Squamous Epithelial / LPF 0-5 0 - 5  CK     Status: None   Collection Time: 04/16/21  5:15 AM  Result Value Ref Range   Total CK 179 49 - 397 U/L   Imaging Studies: No results found.  ED COURSE and MDM  Nursing notes, initial and subsequent vitals signs, including pulse oximetry, reviewed and interpreted by myself.  Vitals:   04/16/21 0515 04/16/21 0530 04/16/21 0600 04/16/21 0612  BP: (!) 148/82 116/61 104/62   Pulse: (!) 105 (!) 105 (!) 107   Resp: 19 (!) 30 (!) 34   Temp:    (!) 100.4 F (38 C)  TempSrc:    Oral  SpO2: 99% 97% 95%   Weight:      Height:       Medications  sodium chloride 0.9 % bolus 1,000 mL (1,000 mLs Intravenous New Bag/Given 04/16/21 0509)  ketorolac (TORADOL) 15 MG/ML injection 15 mg (15 mg Intravenous Given 04/16/21 0510)   6:13 AM No laboratory evidence of rhabdomyolysis.  Patient's headache improved after IV fluids and Toradol.  He has had another fever noted in the ED.  This makes to be more concerned about an infectious source (most likely a viral illness although he is negative for COVID and influenza) then for heat illness as recurrent fevers should not occur in the setting of heat illness once the patient is in a cool environment and well-hydrated.  He was advised to return for any concerning symptoms or worsening condition.   PROCEDURES   Procedures   ED DIAGNOSES     ICD-10-CM   1. Fever in adult  R50.9        Nasiir Monts, MD 04/16/21 0617    Paula Libra, MD 04/16/21 [phone]

## 2024-05-17 ENCOUNTER — Emergency Department (HOSPITAL_COMMUNITY)
Admission: EM | Admit: 2024-05-17 | Discharge: 2024-05-18 | Disposition: A | Discharge status: Home / Self Care | Attending: Emergency Medicine | Admitting: Emergency Medicine

## 2024-05-17 ENCOUNTER — Other Ambulatory Visit: Payer: Self-pay

## 2024-05-17 ENCOUNTER — Encounter (HOSPITAL_COMMUNITY): Payer: Self-pay | Admitting: Emergency Medicine

## 2024-05-17 DIAGNOSIS — R1031 Right lower quadrant pain: Secondary | ICD-10-CM | POA: Diagnosis present

## 2024-05-17 DIAGNOSIS — I10 Essential (primary) hypertension: Secondary | ICD-10-CM | POA: Insufficient documentation

## 2024-05-17 DIAGNOSIS — S39011A Strain of muscle, fascia and tendon of abdomen, initial encounter: Secondary | ICD-10-CM | POA: Insufficient documentation

## 2024-05-17 DIAGNOSIS — X58XXXA Exposure to other specified factors, initial encounter: Secondary | ICD-10-CM | POA: Insufficient documentation

## 2024-05-17 DIAGNOSIS — D72829 Elevated white blood cell count, unspecified: Secondary | ICD-10-CM | POA: Diagnosis not present

## 2024-05-17 LAB — CBC WITH DIFFERENTIAL/PLATELET
Abs Immature Granulocytes: 0.08 10*3/uL — ABNORMAL HIGH (ref 0.00–0.07)
Basophils Absolute: 0.1 10*3/uL (ref 0.0–0.1)
Basophils Relative: 1 %
Eosinophils Absolute: 0.1 10*3/uL (ref 0.0–0.5)
Eosinophils Relative: 1 %
HCT: 46.9 % (ref 39.0–52.0)
Hemoglobin: 16.2 g/dL (ref 13.0–17.0)
Immature Granulocytes: 1 %
Lymphocytes Relative: 18 %
Lymphs Abs: 2.3 10*3/uL (ref 0.7–4.0)
MCH: 30.7 pg (ref 26.0–34.0)
MCHC: 34.5 g/dL (ref 30.0–36.0)
MCV: 89 fL (ref 80.0–100.0)
Monocytes Absolute: 0.8 10*3/uL (ref 0.1–1.0)
Monocytes Relative: 6 %
Neutro Abs: 9.6 10*3/uL — ABNORMAL HIGH (ref 1.7–7.7)
Neutrophils Relative %: 73 %
Platelets: 264 10*3/uL (ref 150–400)
RBC: 5.27 MIL/uL (ref 4.22–5.81)
RDW: 12.2 % (ref 11.5–15.5)
WBC: 12.9 10*3/uL — ABNORMAL HIGH (ref 4.0–10.5)
nRBC: 0 % (ref 0.0–0.2)

## 2024-05-17 NOTE — ED Triage Notes (Signed)
 Patient report testicle pain x 3 weeks. Patient report worsening right testicular pain tonight with walking and moving. Patient denies trauma or injury on the affected site. Patient denies N/V. Patient denies fever. Patient denies dysuria.

## 2024-05-18 ENCOUNTER — Emergency Department (HOSPITAL_COMMUNITY)

## 2024-05-18 ENCOUNTER — Encounter (HOSPITAL_COMMUNITY): Payer: Self-pay

## 2024-05-18 LAB — COMPREHENSIVE METABOLIC PANEL WITH GFR
ALT: 29 U/L (ref 0–44)
AST: 23 U/L (ref 15–41)
Albumin: 4.1 g/dL (ref 3.5–5.0)
Alkaline Phosphatase: 55 U/L (ref 38–126)
Anion gap: 11 (ref 5–15)
BUN: 12 mg/dL (ref 6–20)
CO2: 22 mmol/L (ref 22–32)
Calcium: 8.9 mg/dL (ref 8.9–10.3)
Chloride: 106 mmol/L (ref 98–111)
Creatinine, Ser: 0.79 mg/dL (ref 0.61–1.24)
GFR, Estimated: >60 mL/min (ref >60)
Glucose, Bld: 103 mg/dL — ABNORMAL HIGH (ref 70–99)
Potassium: 3.5 mmol/L (ref 3.5–5.1)
Sodium: 139 mmol/L (ref 135–145)
Total Bilirubin: 0.9 mg/dL (ref 0.0–1.2)
Total Protein: 7.2 g/dL (ref 6.5–8.1)

## 2024-05-18 LAB — URINALYSIS, ROUTINE W REFLEX MICROSCOPIC
Bilirubin Urine: NEGATIVE
Glucose, UA: NEGATIVE mg/dL
Hgb urine dipstick: NEGATIVE
Ketones, ur: NEGATIVE mg/dL
Leukocytes,Ua: NEGATIVE
Nitrite: NEGATIVE
Protein, ur: NEGATIVE mg/dL
Specific Gravity, Urine: >1.046 — ABNORMAL HIGH (ref 1.005–1.030)
pH: 6 (ref 5.0–8.0)

## 2024-05-18 LAB — I-STAT CG4 LACTIC ACID, ED: Lactic Acid, Venous: 1.3 mmol/L (ref 0.5–1.9)

## 2024-05-18 MED ORDER — SODIUM CHLORIDE (PF) 0.9 % IJ SOLN
INTRAMUSCULAR | Status: AC
  Filled 2024-05-18: qty 50

## 2024-05-18 MED ORDER — IOHEXOL 300 MG/ML  SOLN
100.0000 mL | Freq: Once | INTRAMUSCULAR | Status: AC | PRN
  Administered 2024-05-18: 100 mL via INTRAVENOUS

## 2024-05-18 NOTE — Discharge Instructions (Addendum)
 It was a pleasure taking part in your care.  As we discussed, your workup is reassuring.  The CT scan showed no acute process causing your pain.  The ultrasound imaging of your testicles showed no evidence of testicular torsion.  I believe that you have strained an abdominal muscle causing your pain in your right lower quadrant and your testicle.  Please take ibuprofen  or Tylenol  every 6 hours as needed for pain.  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  I have written you a work note requesting this.  Please follow-up with your PCP for further management and care.  Return to the ED with any new or worsening symptoms.

## 2024-05-18 NOTE — ED Provider Notes (Addendum)
 [place] EMERGENCY DEPARTMENT AT [facility] Provider Note   CSN: [id number] Arrival date & time: 05/17/24  2237     Patient presents with: Testicle Pain   Marvin Case is a 38 y.o. male with medical history of anxiety, hypertension.  Patient presents to the ED for evaluation of right lower quadrant pain, right testicle pain.  Reports that for the last 3 weeks he has had right testicle pain.  The patient reports that this was preceded by right abdominal pain.  He states that his abdominal pain will radiate into his right testicle.  He reports that over the last 3 weeks his symptoms have waxed and waned.  They are worse when he attempts to walk long distances or lift heavy objects.  He denies any concern for STI, dysuria, fevers, nausea vomiting, diarrhea at home.  Denies any penile discharge.  Denies any history of hernia.    Testicle Pain Associated symptoms include abdominal pain.       Prior to Admission medications   Medication Sig Start Date End Date Taking? Authorizing Provider  fluticasone  (FLONASE ) 50 MCG/ACT nasal spray Place 2 sprays into both nostrils daily. 01/21/17   Menshew, Candida LULLA Kings, PA-C  hydrOXYzine  (ATARAX /VISTARIL ) 25 MG tablet Take 1 tablet (25 mg total) by mouth every 6 (six) hours as needed for anxiety. 08/03/16   Jacquetta Sharlot GRADE, NP  traMADol  (ULTRAM ) 50 MG tablet 1 tablet every 4-6 hours prn pain 04/15/17   Saunders Shona CROME, PA-C    Allergies: Patient has no known allergies.    Review of Systems  Gastrointestinal:  Positive for abdominal pain.  Genitourinary:  Positive for testicular pain.  All other systems reviewed and are negative.   Updated Vital Signs BP 134/80 (BP Location: Left Arm)   Pulse 97   Temp 98.2 F (36.8 C) (Oral)   Resp 18   SpO2 97%   Physical Exam Vitals and nursing note reviewed.  Constitutional:      General: He is not in acute distress.    Appearance: He is well-developed.  HENT:     Head:  Normocephalic and atraumatic.   Eyes:     Conjunctiva/sclera: Conjunctivae normal.    Cardiovascular:     Rate and Rhythm: Normal rate and regular rhythm.     Heart sounds: No murmur heard. Pulmonary:     Effort: Pulmonary effort is normal. No respiratory distress.     Breath sounds: Normal breath sounds.  Abdominal:     Palpations: Abdomen is soft.     Tenderness: There is no abdominal tenderness.     Comments: Slight TTP of right lower quadrant.  Genitourinary:    Comments: GU examination performed with RN chaperone.  No high-riding testicle, cremasteric reflex present.  No evidence of erythema to right testicle.  Musculoskeletal:        General: No swelling.     Cervical back: Neck supple.   Skin:    General: Skin is warm and dry.     Capillary Refill: Capillary refill takes less than 2 seconds.   Neurological:     Mental Status: He is alert.   Psychiatric:        Mood and Affect: Mood normal.     (all labs ordered are listed, but only abnormal results are displayed) Labs Reviewed  CBC WITH DIFFERENTIAL/PLATELET - Abnormal; Notable for the following components:      Result Value   WBC 12.9 (*)    Neutro Abs 9.6 (*)  Abs Immature Granulocytes 0.08 (*)    All other components within normal limits  COMPREHENSIVE METABOLIC PANEL WITH GFR - Abnormal; Notable for the following components:   Glucose, Bld 103 (*)    All other components within normal limits  CULTURE, BLOOD (ROUTINE X 2)  CULTURE, BLOOD (ROUTINE X 2)  URINALYSIS, ROUTINE W REFLEX MICROSCOPIC  I-STAT CG4 LACTIC ACID, ED    EKG: None  Radiology: CT ABDOMEN PELVIS W CONTRAST Result Date: 05/18/2024 CLINICAL DATA:  Right-sided testicular pain with normal scrotal ultrasound EXAM: CT ABDOMEN AND PELVIS WITH CONTRAST TECHNIQUE: Multidetector CT imaging of the abdomen and pelvis was performed using the standard protocol following bolus administration of intravenous contrast. RADIATION DOSE REDUCTION: This  exam was performed according to the departmental dose-optimization program which includes automated exposure control, adjustment of the mA and/or kV according to patient size and/or use of iterative reconstruction technique. CONTRAST:  OMNIPAQUE  IOHEXOL  300 MG/ML  SOLN COMPARISON:  None Available. FINDINGS: Lower chest: No acute abnormality. Hepatobiliary: No focal liver abnormality is seen. No gallstones, gallbladder wall thickening, or biliary dilatation. Pancreas: Unremarkable. No pancreatic ductal dilatation or surrounding inflammatory changes. Spleen: Normal in size without focal abnormality. Adrenals/Urinary Tract: Adrenal glands are within normal limits. Kidneys demonstrate a normal enhancement pattern bilaterally. No renal calculi or obstructive changes are seen. The bladder is decompressed. Stomach/Bowel: Mild diverticular change of the colon is noted. No obstructive changes are seen. The appendix is within normal limits. Small bowel and stomach are within normal limits. Vascular/Lymphatic: No significant vascular findings are present. No enlarged abdominal or pelvic lymph nodes. Reproductive: Prostate is unremarkable. Other: No abdominal wall hernia or abnormality. No abdominopelvic ascites. Musculoskeletal: No acute or significant osseous findings. IMPRESSION: Diverticulosis without diverticulitis. Electronically Signed   By: Oneil Devonshire M.D.   On: 05/18/2024 03:35   US  SCROTUM W/DOPPLER Result Date: 05/18/2024 CLINICAL DATA:  Right-sided testicular pain for 3 days EXAM: SCROTAL ULTRASOUND DOPPLER ULTRASOUND OF THE TESTICLES TECHNIQUE: Complete ultrasound examination of the testicles, epididymis, and other scrotal structures was performed. Color and spectral Doppler ultrasound were also utilized to evaluate blood flow to the testicles. COMPARISON:  None Available. FINDINGS: Right testicle Measurements: 4.8 x 2.0 x 3.1 cm. No mass or microlithiasis visualized. Left testicle Measurements: 5.0 x  2.2 x 2.8 cm. No mass or microlithiasis visualized. Right epididymis:  Normal in size and appearance. Left epididymis:  Normal in size and appearance. Hydrocele:  None visualized. Varicocele:  Left-sided varicocele is noted. Pulsed Doppler interrogation of both testes demonstrates normal low resistance arterial and venous waveforms bilaterally. IMPRESSION: Small left-sided varicocele. No other focal abnormality is noted. Electronically Signed   By: Oneil Devonshire M.D.   On: 05/18/2024 01:49     Procedures   Medications Ordered in the ED  iohexol  (OMNIPAQUE ) 300 MG/ML solution 100 mL (100 mLs Intravenous Contrast Given 05/18/24 0305)     Medical Decision Making Amount and/or Complexity of Data Reviewed Labs: ordered. Radiology: ordered.  Risk Prescription drug management.   38 year old no presents for evaluation.  Please see HPI for further details.  On examination the patient is afebrile and tachycardic.  Lung sounds are clear bilaterally, he is not hypoxic.  Abdomen is soft and compressible with tenderness in right lower quadrant.  GU examination reveals a right testicle that is not high riding, cremasteric reflexes present.  Neurological examination at baseline.  Differential diagnosis is broad for this patient includes testicular torsion versus epididymitis versus orchitis versus appendicitis.  Will assess  with CBC, CMP.  Patient arrived tachycardic and does have an elevated white count of 12.9 so I have added on lactic acid and blood cultures.  I have also added on urinalysis.  Patient CBC with leukocytosis of 12.9, no anemia.  Metabolic panel unremarkable, no metabolic abnormality, no elevated LFTs, anion gap 11.  Lactic acid not elevated at 1.3.  Urinalysis is unremarkable.  Ultrasound imaging unremarkable.  Small left-sided hydrocele however patient reports pain on right side.  Will proceed to CT scan.  CT scan unremarkable.  I suspect patient suffering from abdominal strain.   Unremarkable CT scan.  Unremarkable lab work.  Patient will be discharged home at this time.  As stated earlier, will call patient back with urinalysis results if noted to be abnormal.  Patient agreeable to plan.  Patient reports he will follow-up with his PCP.  I have written him a note requesting light duty for the next 4 days at his job.  He is stable to discharge home.    Final diagnoses:  Strain of abdominal muscle, initial encounter    ED Discharge Orders     None           Ruthell Lonni JULIANNA DEVONNA 05/18/24 0511    Trine Raynell Moder, MD 05/18/24 [phone]

## 2024-05-18 NOTE — ED Notes (Signed)
Lactic 1.31

## 2024-05-23 LAB — CULTURE, BLOOD (ROUTINE X 2)
Culture: NO GROWTH
Culture: NO GROWTH
